# Patient Record
Sex: Female | Born: 1995 | Race: Asian | Hispanic: No | Marital: Single | Smoking: Never smoker
Health system: Southern US, Community
[De-identification: ages and names within clinical notes are randomized; demographics above are authoritative.]

## PROBLEM LIST (undated history)

## (undated) DIAGNOSIS — Z789 Other specified health status: Secondary | ICD-10-CM

## (undated) DIAGNOSIS — F32A Depression, unspecified: Secondary | ICD-10-CM

## (undated) HISTORY — PX: NO PAST SURGERIES: SHX2092

---
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
## 2017-08-13 ENCOUNTER — Encounter (HOSPITAL_BASED_OUTPATIENT_CLINIC_OR_DEPARTMENT_OTHER): Payer: Self-pay | Admitting: *Deleted

## 2017-08-13 ENCOUNTER — Emergency Department (HOSPITAL_BASED_OUTPATIENT_CLINIC_OR_DEPARTMENT_OTHER)
Admission: EM | Admit: 2017-08-13 | Discharge: 2017-08-13 | Disposition: A | Discharge status: Home / Self Care | Payer: Self-pay | Attending: Emergency Medicine | Admitting: Emergency Medicine

## 2017-08-13 ENCOUNTER — Other Ambulatory Visit: Payer: Self-pay

## 2017-08-13 DIAGNOSIS — Y999 Unspecified external cause status: Secondary | ICD-10-CM | POA: Insufficient documentation

## 2017-08-13 DIAGNOSIS — Y9389 Activity, other specified: Secondary | ICD-10-CM | POA: Insufficient documentation

## 2017-08-13 DIAGNOSIS — Y92410 Unspecified street and highway as the place of occurrence of the external cause: Secondary | ICD-10-CM | POA: Insufficient documentation

## 2017-08-13 DIAGNOSIS — T148XXA Other injury of unspecified body region, initial encounter: Secondary | ICD-10-CM | POA: Insufficient documentation

## 2017-08-13 MED ORDER — METHOCARBAMOL 500 MG PO TABS: 500.0000 mg | ORAL_TABLET | Freq: Two times a day (BID) | ORAL | 0 refills | Status: AC
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
## 2017-08-13 NOTE — Discharge Instructions (Signed)
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
## 2018-01-19 DIAGNOSIS — Z3200 Encounter for pregnancy test, result unknown: Secondary | ICD-10-CM | POA: Diagnosis not present

## 2018-01-30 DIAGNOSIS — Z3490 Encounter for supervision of normal pregnancy, unspecified, unspecified trimester: Secondary | ICD-10-CM | POA: Insufficient documentation

## 2018-01-31 ENCOUNTER — Ambulatory Visit (INDEPENDENT_AMBULATORY_CARE_PROVIDER_SITE_OTHER): Payer: Medicaid Other | Admitting: Certified Nurse Midwife

## 2018-01-31 ENCOUNTER — Other Ambulatory Visit (HOSPITAL_COMMUNITY)
Admission: RE | Admit: 2018-01-31 | Discharge: 2018-01-31 | Disposition: A | Discharge status: Home / Self Care | Payer: Medicaid Other | Source: Ambulatory Visit | Attending: Certified Nurse Midwife | Admitting: Certified Nurse Midwife

## 2018-01-31 ENCOUNTER — Encounter: Payer: Self-pay | Admitting: Certified Nurse Midwife

## 2018-01-31 VITALS — BP 106/66 | HR 91 | Wt 109.4 lb

## 2018-01-31 DIAGNOSIS — Z3401 Encounter for supervision of normal first pregnancy, first trimester: Secondary | ICD-10-CM | POA: Diagnosis not present

## 2018-01-31 DIAGNOSIS — Z3491 Encounter for supervision of normal pregnancy, unspecified, first trimester: Secondary | ICD-10-CM | POA: Diagnosis not present

## 2018-01-31 DIAGNOSIS — Z3A10 10 weeks gestation of pregnancy: Secondary | ICD-10-CM | POA: Diagnosis not present

## 2018-01-31 DIAGNOSIS — Z3490 Encounter for supervision of normal pregnancy, unspecified, unspecified trimester: Secondary | ICD-10-CM

## 2018-01-31 DIAGNOSIS — Z36 Encounter for antenatal screening for chromosomal anomalies: Secondary | ICD-10-CM | POA: Diagnosis not present

## 2018-01-31 NOTE — Patient Instructions (Signed)
First Trimester of Pregnancy The first trimester of pregnancy is from week 1 until the end of week 13 (months 1 through 3). During this time, your baby will begin to develop inside you. At 6-8 weeks, the eyes and face are formed, and the heartbeat can be seen on ultrasound. At the end of 12 weeks, all the baby's organs are formed. Prenatal care is all the medical care you receive before the birth of your baby. Make sure you get good prenatal care and follow all of your doctor's instructions. Follow these instructions at home: Medicines  Take over-the-counter and prescription medicines only as told by your doctor. Some medicines are safe and some medicines are not safe during pregnancy.  Take a prenatal vitamin that contains at least 600 micrograms (mcg) of folic acid.  If you have trouble pooping (constipation), take medicine that will make your stool soft (stool softener) if your doctor approves. Eating and drinking  Eat regular, healthy meals.  Your doctor will tell you the amount of weight gain that is right for you.  Avoid raw meat and uncooked cheese.  If you feel sick to your stomach (nauseous) or throw up (vomit): ? Eat 4 or 5 small meals a day instead of 3 large meals. ? Try eating a few soda crackers. ? Drink liquids between meals instead of during meals.  To prevent constipation: ? Eat foods that are high in fiber, like fresh fruits and vegetables, whole grains, and beans. ? Drink enough fluids to keep your pee (urine) clear or pale yellow. Activity  Exercise only as told by your doctor. Stop exercising if you have cramps or pain in your lower belly (abdomen) or low back.  Do not exercise if it is too hot, too humid, or if you are in a place of great height (high altitude).  Try to avoid standing for long periods of time. Move your legs often if you must stand in one place for a long time.  Avoid heavy lifting.  Wear low-heeled shoes. Sit and stand up straight.  You  can have sex unless your doctor tells you not to. Relieving pain and discomfort  Wear a good support bra if your breasts are sore.  Take warm water baths (sitz baths) to soothe pain or discomfort caused by hemorrhoids. Use hemorrhoid cream if your doctor says it is okay.  Rest with your legs raised if you have leg cramps or low back pain.  If you have puffy, bulging veins (varicose veins) in your legs: ? Wear support hose or compression stockings as told by your doctor. ? Raise (elevate) your feet for 15 minutes, 3-4 times a day. ? Limit salt in your food. Prenatal care  Schedule your prenatal visits by the twelfth week of pregnancy.  Write down your questions. Take them to your prenatal visits.  Keep all your prenatal visits as told by your doctor. This is important. Safety  Wear your seat belt at all times when driving.  Make a list of emergency phone numbers. The list should include numbers for family, friends, the hospital, and police and fire departments. General instructions  Ask your doctor for a referral to a local prenatal class. Begin classes no later than at the start of month 6 of your pregnancy.  Ask for help if you need counseling or if you need help with nutrition. Your doctor can give you advice or tell you where to go for help.  Do not use hot tubs, steam rooms, or   saunas.  Do not douche or use tampons or scented sanitary pads.  Do not cross your legs for long periods of time.  Avoid all herbs and alcohol. Avoid drugs that are not approved by your doctor.  Do not use any tobacco products, including cigarettes, chewing tobacco, and electronic cigarettes. If you need help quitting, ask your doctor. You may get counseling or other support to help you quit.  Avoid cat litter boxes and soil used by cats. These carry germs that can cause birth defects in the baby and can cause a loss of your baby (miscarriage) or stillbirth.  Visit your dentist. At home, brush  your teeth with a soft toothbrush. Be gentle when you floss. Contact a doctor if:  You are dizzy.  You have mild cramps or pressure in your lower belly.  You have a nagging pain in your belly area.  You continue to feel sick to your stomach, you throw up, or you have watery poop (diarrhea).  You have a bad smelling fluid coming from your vagina.  You have pain when you pee (urinate).  You have increased puffiness (swelling) in your face, hands, legs, or ankles. Get help right away if:  You have a fever.  You are leaking fluid from your vagina.  You have spotting or bleeding from your vagina.  You have very bad belly cramping or pain.  You gain or lose weight rapidly.  You throw up blood. It may look like coffee grounds.  You are around people who have German measles, fifth disease, or chickenpox.  You have a very bad headache.  You have shortness of breath.  You have any kind of trauma, such as from a fall or a car accident. Summary  The first trimester of pregnancy is from week 1 until the end of week 13 (months 1 through 3).  To take care of yourself and your unborn baby, you will need to eat healthy meals, take medicines only if your doctor tells you to do so, and do activities that are safe for you and your baby.  Keep all follow-up visits as told by your doctor. This is important as your doctor will have to ensure that your baby is healthy and growing well. This information is not intended to replace advice given to you by your health care provider. Make sure you discuss any questions you have with your health care provider. Document Released: 10/05/2007 Document Revised: 04/26/2016 Document Reviewed: 04/26/2016 Elsevier Interactive Patient Education  2017 Elsevier Inc.  

## 2018-01-31 NOTE — Progress Notes (Signed)
Patient is in the office for initial ob visit. FOB is involved and living together.

## 2018-01-31 NOTE — Progress Notes (Signed)
Subjective:   Cynthia Arnold is a 22 y.o. G1P0 at [redacted]w[redacted]d by LMP being seen today for her first obstetrical visit.  Her obstetrical history is significant for nothing. Patient does intend to breast feed. Pregnancy history fully reviewed.  Patient reports no complaints.  HISTORY: OB History  Gravida Para Term Preterm AB Living  1 0 0 0 0 0  SAB TAB Ectopic Multiple Live Births  0 0 0 0 0    # Outcome Date GA Lbr Len/2nd Weight Sex Delivery Anes PTL Lv  1 Current            She has never had Pap smear   History reviewed. No pertinent past medical history. History reviewed. No pertinent surgical history. History reviewed. No pertinent family history. Social History   Tobacco Use  . Smoking status: Never Smoker  . Smokeless tobacco: Never Used  Substance Use Topics  . Alcohol use: Yes  . Drug use: Not Currently   No Known Allergies Current Outpatient Medications on File Prior to Visit  Medication Sig Dispense Refill  . Prenatal Vit-Fe Fumarate-FA (PRENATAL MULTIVITAMIN) TABS tablet Take 1 tablet by mouth daily at 12 noon.     No current facility-administered medications on file prior to visit.     Review of Systems Pertinent items noted in HPI and remainder of comprehensive ROS otherwise negative.  Exam   Vitals:   01/31/18 1349  BP: 106/66  Pulse: 91  Weight: 109 lb 6.4 oz (49.6 kg)   Fetal Heart Rate (bpm): 167  Uterus:   retroverted, above suprapubic   Pelvic Exam: Perineum: no hemorrhoids, normal perineum   Vulva: normal external genitalia, no lesions   Vagina:  normal mucosa, normal discharge   Cervix: no lesions and normal, pap smear done. Anterior cervix   Adnexa: normal adnexa and no mass, fullness, tenderness   Bony Pelvis: average  System: General: well-developed, well-nourished female in no acute distress   Breast:  normal appearance, no masses or tenderness   Skin: normal coloration and turgor, no rashes   Neurologic: oriented, normal, negative,  normal mood   Extremities: normal strength, tone, and muscle mass, ROM of all joints is normal   HEENT PERRLA, extraocular movement intact and sclera clear   Mouth/Teeth mucous membranes moist, pharynx normal without lesions and dental hygiene good   Neck supple and no masses   Cardiovascular: regular rate and rhythm   Respiratory:  no respiratory distress, normal breath sounds   Abdomen: soft, non-tender; bowel sounds normal; no masses,  no organomegaly     Assessment:   Pregnancy: G1P0 Patient Active Problem List   Diagnosis Date Noted  . Supervision of normal pregnancy 01/30/2018     Plan:  1. Encounter for supervision of normal first pregnancy in first trimester - Patient doing well no complaints  - Enroll Patient in Babyscripts - Culture, OB Urine - Obstetric Panel, Including HIV - Genetic Screening - Hemoglobinopathy evaluation - Cystic Fibrosis Mutation 97 - Cytology - PAP   Initial labs drawn. Continue prenatal vitamins. Genetic Screening discussed, NIPS: ordered. Ultrasound discussed; fetal anatomic survey: requested. Problem list reviewed and updated. The nature of [facility] with multiple MDs and other Advanced Practice Providers was explained to patient; also emphasized that residents, students are part of our team. Routine obstetric precautions reviewed. Return in about 4 weeks (around 02/28/2018) for ROB.    Sharyon Cable, CNM [facility] [employer]

## 2018-02-01 LAB — CYTOLOGY - PAP
Bacterial vaginitis: NEGATIVE
Candida vaginitis: NEGATIVE
Chlamydia: NEGATIVE
Diagnosis: NEGATIVE
Neisseria Gonorrhea: NEGATIVE
Trichomonas: NEGATIVE

## 2018-02-03 LAB — HEMOGLOBINOPATHY EVALUATION
HGB C: 0 %
HGB S: 0 %
HGB VARIANT: 0 %
Hemoglobin A2 Quantitation: 2.3 % (ref 1.8–3.2)
Hemoglobin F Quantitation: 0 % (ref 0.0–2.0)
Hgb A: 97.7 % (ref 96.4–98.8)

## 2018-02-03 LAB — OBSTETRIC PANEL, INCLUDING HIV
Antibody Screen: NEGATIVE
Basophils Absolute: 0.1 10*3/uL (ref 0.0–0.2)
Basos: 1 %
EOS (ABSOLUTE): 0.1 10*3/uL (ref 0.0–0.4)
Eos: 1 %
HIV Screen 4th Generation wRfx: NONREACTIVE
Hematocrit: 34.7 % (ref 34.0–46.6)
Hemoglobin: 11.5 g/dL (ref 11.1–15.9)
Hepatitis B Surface Ag: NEGATIVE
Immature Grans (Abs): 0 10*3/uL (ref 0.0–0.1)
Immature Granulocytes: 1 %
Lymphocytes Absolute: 1.3 10*3/uL (ref 0.7–3.1)
Lymphs: 16 %
MCH: 30.7 pg (ref 26.6–33.0)
MCHC: 33.1 g/dL (ref 31.5–35.7)
MCV: 93 fL (ref 79–97)
Monocytes Absolute: 0.6 10*3/uL (ref 0.1–0.9)
Monocytes: 8 %
Neutrophils Absolute: 6.1 10*3/uL (ref 1.4–7.0)
Neutrophils: 73 %
Platelets: 298 10*3/uL (ref 150–450)
RBC: 3.74 x10E6/uL — ABNORMAL LOW (ref 3.77–5.28)
RDW: 11.5 % — ABNORMAL LOW (ref 12.3–15.4)
RPR Ser Ql: NONREACTIVE
Rh Factor: POSITIVE
Rubella Antibodies, IGG: 2.87 index (ref >0.99)
WBC: 8.2 10*3/uL (ref 3.4–10.8)

## 2018-02-03 LAB — URINE CULTURE, OB REFLEX: Organism ID, Bacteria: NO GROWTH

## 2018-02-03 LAB — CULTURE, OB URINE

## 2018-02-06 ENCOUNTER — Encounter: Payer: Self-pay | Admitting: Certified Nurse Midwife

## 2018-02-08 LAB — CYSTIC FIBROSIS MUTATION 97: Interpretation: NOT DETECTED

## 2018-02-13 ENCOUNTER — Encounter: Payer: Self-pay | Admitting: *Deleted

## 2018-02-28 ENCOUNTER — Encounter: Payer: Self-pay | Admitting: Certified Nurse Midwife

## 2018-02-28 ENCOUNTER — Ambulatory Visit (INDEPENDENT_AMBULATORY_CARE_PROVIDER_SITE_OTHER): Payer: Medicaid Other | Admitting: Certified Nurse Midwife

## 2018-02-28 DIAGNOSIS — Z3490 Encounter for supervision of normal pregnancy, unspecified, unspecified trimester: Secondary | ICD-10-CM

## 2018-02-28 NOTE — Patient Instructions (Signed)
Second Trimester of Pregnancy The second trimester is from week 13 through week 28, month 4 through 6. This is often the time in pregnancy that you feel your best. Often times, morning sickness has lessened or quit. You may have more energy, and you may get hungry more often. Your unborn baby (fetus) is growing rapidly. At the end of the sixth month, he or she is about 9 inches long and weighs about 1 pounds. You will likely feel the baby move (quickening) between 18 and 20 weeks of pregnancy. Follow these instructions at home:  Avoid all smoking, herbs, and alcohol. Avoid drugs not approved by your doctor.  Do not use any tobacco products, including cigarettes, chewing tobacco, and electronic cigarettes. If you need help quitting, ask your doctor. You may get counseling or other support to help you quit.  Only take medicine as told by your doctor. Some medicines are safe and some are not during pregnancy.  Exercise only as told by your doctor. Stop exercising if you start having cramps.  Eat regular, healthy meals.  Wear a good support bra if your breasts are tender.  Do not use hot tubs, steam rooms, or saunas.  Wear your seat belt when driving.  Avoid raw meat, uncooked cheese, and liter boxes and soil used by cats.  Take your prenatal vitamins.  Take 1500-2000 milligrams of calcium daily starting at the 20th week of pregnancy until you deliver your baby.  Try taking medicine that helps you poop (stool softener) as needed, and if your doctor approves. Eat more fiber by eating fresh fruit, vegetables, and whole grains. Drink enough fluids to keep your pee (urine) clear or pale yellow.  Take warm water baths (sitz baths) to soothe pain or discomfort caused by hemorrhoids. Use hemorrhoid cream if your doctor approves.  If you have puffy, bulging veins (varicose veins), wear support hose. Raise (elevate) your feet for 15 minutes, 3-4 times a day. Limit salt in your diet.  Avoid heavy  lifting, wear low heals, and sit up straight.  Rest with your legs raised if you have leg cramps or low back pain.  Visit your dentist if you have not gone during your pregnancy. Use a soft toothbrush to brush your teeth. Be gentle when you floss.  You can have sex (intercourse) unless your doctor tells you not to.  Go to your doctor visits. Get help if:  You feel dizzy.  You have mild cramps or pressure in your lower belly (abdomen).  You have a nagging pain in your belly area.  You continue to feel sick to your stomach (nauseous), throw up (vomit), or have watery poop (diarrhea).  You have bad smelling fluid coming from your vagina.  You have pain with peeing (urination). Get help right away if:  You have a fever.  You are leaking fluid from your vagina.  You have spotting or bleeding from your vagina.  You have severe belly cramping or pain.  You lose or gain weight rapidly.  You have trouble catching your breath and have chest pain.  You notice sudden or extreme puffiness (swelling) of your face, hands, ankles, feet, or legs.  You have not felt the baby move in over an hour.  You have severe headaches that do not go away with medicine.  You have vision changes. This information is not intended to replace advice given to you by your health care provider. Make sure you discuss any questions you have with your health care   provider. Document Released: 07/13/2009 Document Revised: 09/24/2015 Document Reviewed: 06/19/2012 Elsevier Interactive Patient Education  2017 Elsevier Inc.  

## 2018-02-28 NOTE — Progress Notes (Signed)
   PRENATAL VISIT NOTE  Subjective:  Cynthia Arnold is a 22 y.o. G1P0 at [redacted]w[redacted]d being seen today for ongoing prenatal care.  She is currently monitored for the following issues for this low-risk pregnancy and has Supervision of normal pregnancy on their problem list.  Patient reports no complaints.  Contractions: Not present. Vag. Bleeding: None.  Movement: Absent. Denies leaking of fluid.   The following portions of the patient's history were reviewed and updated as appropriate: allergies, current medications, past family history, past medical history, past social history, past surgical history and problem list. Problem list updated.  Objective:   Vitals:   02/28/18 1022  BP: 105/66  Pulse: 88  Weight: 112 lb (50.8 kg)    Fetal Status: Fetal Heart Rate (bpm): 148   Movement: Absent     General:  Alert, oriented and cooperative. Patient is in no acute distress.  Skin: Skin is warm and dry. No rash noted.   Cardiovascular: Normal heart rate noted  Respiratory: Normal respiratory effort, no problems with respiration noted  Abdomen: Soft, gravid, appropriate for gestational age.  Pain/Pressure: Present     Pelvic: Cervical exam deferred        Extremities: Normal range of motion.  Edema: None  Mental Status: Normal mood and affect. Normal behavior. Normal judgment and thought content.   Assessment and Plan:  Pregnancy: G1P0 at [redacted]w[redacted]d  1. Encounter for supervision of normal pregnancy, antepartum, unspecified gravidity - Patient doing well, no complaints  - Anticipatory guidance on upcoming appointments  - Anatomy US ordered - Results of NOB labs reviewed with patient  - [place] MFM OB COMP + 14 WK; Future  Preterm labor symptoms and general obstetric precautions including but not limited to vaginal bleeding, contractions, leaking of fluid and fetal movement were reviewed in detail with the patient. Please refer to After Visit Summary for other counseling recommendations.  Return in about  4 weeks (around 03/28/2018) for ROB.  Future Appointments  Date Time Provider Department Center  03/28/2018  9:15 AM Hermina Staggers, MD [facility] None  04/03/2018  8:45 AM [facility] [place] 2 [facility]    Sharyon Cable, CNM

## 2018-02-28 NOTE — Progress Notes (Signed)
Pt has no concerns today 

## 2018-03-26 ENCOUNTER — Encounter (HOSPITAL_COMMUNITY): Payer: Self-pay

## 2018-03-28 ENCOUNTER — Ambulatory Visit (INDEPENDENT_AMBULATORY_CARE_PROVIDER_SITE_OTHER): Payer: Medicaid Other | Admitting: Obstetrics and Gynecology

## 2018-03-28 ENCOUNTER — Encounter: Payer: Self-pay | Admitting: Obstetrics and Gynecology

## 2018-03-28 VITALS — BP 96/64 | HR 88 | Wt 116.0 lb

## 2018-03-28 DIAGNOSIS — Z3A18 18 weeks gestation of pregnancy: Secondary | ICD-10-CM

## 2018-03-28 DIAGNOSIS — Z3402 Encounter for supervision of normal first pregnancy, second trimester: Secondary | ICD-10-CM

## 2018-03-28 DIAGNOSIS — Z23 Encounter for immunization: Secondary | ICD-10-CM | POA: Diagnosis not present

## 2018-03-28 NOTE — Progress Notes (Signed)
ROB w/ no concerns today.  

## 2018-03-28 NOTE — Patient Instructions (Signed)

## 2018-03-28 NOTE — Addendum Note (Signed)
Addended by: Marya LandryFOSTER, SUZANNE D on: 03/28/2018 10:24 AM   Modules accepted: Orders

## 2018-03-28 NOTE — Progress Notes (Signed)
Subjective:  Cynthia Arnold is a 22 y.o. G1P0 at 269w5d being seen today for ongoing prenatal care.  She is currently monitored for the following issues for this low-risk pregnancy and has Supervision of normal pregnancy on their problem list.  Patient reports no complaints.  Contractions: Not present. Vag. Bleeding: None.  Movement: Absent. Denies leaking of fluid.   The following portions of the patient's history were reviewed and updated as appropriate: allergies, current medications, past family history, past medical history, past social history, past surgical history and problem list. Problem list updated.  Objective:   Vitals:   03/28/18 0938  BP: 96/64  Pulse: 88  Weight: 116 lb (52.6 kg)    Fetal Status: Fetal Heart Rate (bpm): 146   Movement: Absent     General:  Alert, oriented and cooperative. Patient is in no acute distress.  Skin: Skin is warm and dry. No rash noted.   Cardiovascular: Normal heart rate noted  Respiratory: Normal respiratory effort, no problems with respiration noted  Abdomen: Soft, gravid, appropriate for gestational age. Pain/Pressure: Absent     Pelvic:  Cervical exam deferred        Extremities: Normal range of motion.  Edema: None  Mental Status: Normal mood and affect. Normal behavior. Normal judgment and thought content.   Urinalysis:      Assessment and Plan:  Pregnancy: G1P0 at 649w5d  1. Encounter for supervision of normal first pregnancy in second trimester Flu vaccine today Declines AFP Anatomy scan next week  Preterm labor symptoms and general obstetric precautions including but not limited to vaginal bleeding, contractions, leaking of fluid and fetal movement were reviewed in detail with the patient. Please refer to After Visit Summary for other counseling recommendations.  Return in about 4 weeks (around 04/25/2018) for OB visit.   Hermina StaggersErvin,  L, MD

## 2018-04-03 ENCOUNTER — Ambulatory Visit (HOSPITAL_COMMUNITY)
Admission: RE | Admit: 2018-04-03 | Discharge: 2018-04-03 | Disposition: A | Discharge status: Home / Self Care | Payer: Medicaid Other | Source: Ambulatory Visit | Attending: Certified Nurse Midwife | Admitting: Certified Nurse Midwife

## 2018-04-03 DIAGNOSIS — Z363 Encounter for antenatal screening for malformations: Secondary | ICD-10-CM | POA: Insufficient documentation

## 2018-04-03 DIAGNOSIS — Z3490 Encounter for supervision of normal pregnancy, unspecified, unspecified trimester: Secondary | ICD-10-CM

## 2018-04-03 DIAGNOSIS — Z3A19 19 weeks gestation of pregnancy: Secondary | ICD-10-CM | POA: Diagnosis not present

## 2018-04-23 ENCOUNTER — Encounter: Payer: Self-pay | Admitting: Obstetrics and Gynecology

## 2018-04-23 ENCOUNTER — Ambulatory Visit (INDEPENDENT_AMBULATORY_CARE_PROVIDER_SITE_OTHER): Payer: Medicaid Other | Admitting: Obstetrics and Gynecology

## 2018-04-23 VITALS — BP 99/61 | HR 87 | Wt 122.1 lb

## 2018-04-23 DIAGNOSIS — Z3402 Encounter for supervision of normal first pregnancy, second trimester: Secondary | ICD-10-CM

## 2018-04-23 NOTE — Progress Notes (Signed)
Pt is here for ROB. 2972w3d.

## 2018-04-23 NOTE — Patient Instructions (Signed)

## 2018-04-23 NOTE — Progress Notes (Signed)
Subjective:  Cynthia Arnold is a 22 y.o. G1P0 at 7838w3d being seen today for ongoing prenatal care.  She is currently monitored for the following issues for this low-risk pregnancy and has Supervision of normal pregnancy on their problem list.  Patient reports no complaints.  Contractions: Not present. Vag. Bleeding: None.  Movement: Present. Denies leaking of fluid.   The following portions of the patient's history were reviewed and updated as appropriate: allergies, current medications, past family history, past medical history, past social history, past surgical history and problem list. Problem list updated.  Objective:   Vitals:   04/23/18 1020  BP: 99/61  Pulse: 87  Weight: 122 lb 1.6 oz (55.4 kg)    Fetal Status: Fetal Heart Rate (bpm): 148   Movement: Present     General:  Alert, oriented and cooperative. Patient is in no acute distress.  Skin: Skin is warm and dry. No rash noted.   Cardiovascular: Normal heart rate noted  Respiratory: Normal respiratory effort, no problems with respiration noted  Abdomen: Soft, gravid, appropriate for gestational age. Pain/Pressure: Absent     Pelvic:  Cervical exam deferred        Extremities: Normal range of motion.  Edema: None  Mental Status: Normal mood and affect. Normal behavior. Normal judgment and thought content.   Urinalysis:      Assessment and Plan:  Pregnancy: G1P0 at 2338w3d  1. Encounter for supervision of normal first pregnancy in second trimester Stable  Preterm labor symptoms and general obstetric precautions including but not limited to vaginal bleeding, contractions, leaking of fluid and fetal movement were reviewed in detail with the patient. Please refer to After Visit Summary for other counseling recommendations.  Return in about 4 weeks (around 05/21/2018) for OB visit.   Hermina StaggersErvin, Charisma Charlot L, MD

## 2018-05-02 NOTE — L&D Delivery Note (Signed)
OB/[facility]N Faculty [facility] Delivery Note  Cynthia Arnold is a 23 y.o. G1P1001 s/p SVD at [redacted]w[redacted]d. She was admitted for post-dates induction of labor.   ROM: 7h 89m with meconium-stained fluid GBS Status: negative Maximum Maternal Temperature: Temp (48hrs), Avg:98.9 F (37.2 C), Min:98.3 F (36.8 C), Max:99.5 F (37.5 C)  Labor Progress: . Admitted and induction started with FB and cytotec . Transitioned to pitocin . Epidural placement . AROM light meconium fluid . Progressed to complete, labored down for about an hour because not feeling pressure  Delivery Date/Time: 09/01/18 at 0822 Delivery: Called to room and patient was complete and pushing. Head delivered ROA. No nuchal cord present. Shoulder and body delivered in usual fashion. Infant with spontaneous cry, placed on mother's abdomen, dried and stimulated. Cord clamped x 2 after 1-minute delay, and cut by father of baby. Cord blood drawn. Placenta delivered spontaneously with gentle cord traction. Fundus firm with massage and Pitocin. Labia, perineum, vagina, and cervix inspected inspected with 1st degree perineal laceration extending into labia and vaginal sidewall (stellate) as well as periurethral laceration.   Placenta: spontaneous, intact, 3-vessel cord (to be discarded) Complications: PPH - mostly related to persistently bleeding stellate laceration, excellent tone and given IM methergine x 1 Lacerations: 1st degree perineal extending into left labial and left vaginal side wall just distal to hymenal ring - appreciate Dr. Deretha Emory assistance with repair - use of 3-0 Vicryl, 3-0 Vicryl Rapide and 4-0 Monocryl - repaired in usual fashion with locked running suture as well as several figure of eight sutures - called to room about 2 hours after delivery because of concern for perineal hematoma but on exam no area of fluctuance and more consistent with swelling related to repair EBL: 1032cc per triton Analgesia: epidural   Postpartum  Planning [x]  message to sent to schedule follow-up  [x]  vaccines UTD  Infant: Vigorous female  APGARs 8, 9  3019g  Meyer Arora S. Earlene Plater, DO OB/GYN Fellow, Faculty Practice

## 2018-05-21 ENCOUNTER — Encounter: Payer: Self-pay | Admitting: Obstetrics and Gynecology

## 2018-05-21 ENCOUNTER — Ambulatory Visit (INDEPENDENT_AMBULATORY_CARE_PROVIDER_SITE_OTHER): Payer: Medicaid Other | Admitting: Obstetrics and Gynecology

## 2018-05-21 DIAGNOSIS — Z3402 Encounter for supervision of normal first pregnancy, second trimester: Secondary | ICD-10-CM

## 2018-05-21 NOTE — Progress Notes (Signed)
   PRENATAL VISIT NOTE  Subjective:  Cynthia Arnold is a 23 y.o. G1P0 at [redacted]w[redacted]d being seen today for ongoing prenatal care.  She is currently monitored for the following issues for this low-risk pregnancy and has Supervision of normal pregnancy on their problem list.  Patient reports no complaints.  Contractions: Not present. Vag. Bleeding: None.  Movement: Present. Denies leaking of fluid.   The following portions of the patient's history were reviewed and updated as appropriate: allergies, current medications, past family history, past medical history, past social history, past surgical history and problem list. Problem list updated.  Objective:   Vitals:   05/21/18 0937  BP: 110/72  Pulse: 92  Weight: 124 lb 9.6 oz (56.5 kg)    Fetal Status: Fetal Heart Rate (bpm): 140 Fundal Height: 26 cm Movement: Present     General:  Alert, oriented and cooperative. Patient is in no acute distress.  Skin: Skin is warm and dry. No rash noted.   Cardiovascular: Normal heart rate noted  Respiratory: Normal respiratory effort, no problems with respiration noted  Abdomen: Soft, gravid, appropriate for gestational age.  Pain/Pressure: Absent     Pelvic: Cervical exam deferred        Extremities: Normal range of motion.  Edema: None  Mental Status: Normal mood and affect. Normal behavior. Normal judgment and thought content.   Assessment and Plan:  Pregnancy: G1P0 at [redacted]w[redacted]d  1. Encounter for supervision of normal first pregnancy in second trimester Patient is doing well without complaints Third trimester labs next visit  Preterm labor symptoms and general obstetric precautions including but not limited to vaginal bleeding, contractions, leaking of fluid and fetal movement were reviewed in detail with the patient. Please refer to After Visit Summary for other counseling recommendations.  Return in about 2 weeks (around 06/04/2018) for ROB, 2 hr glucola next visit.  No future appointments.  Catalina Antigua, MD

## 2018-05-21 NOTE — Progress Notes (Signed)
Patient reports fetal movement, denies pain. 

## 2018-06-04 ENCOUNTER — Encounter: Payer: Self-pay | Admitting: Obstetrics and Gynecology

## 2018-06-04 ENCOUNTER — Other Ambulatory Visit: Payer: Medicaid Other

## 2018-06-04 ENCOUNTER — Ambulatory Visit (INDEPENDENT_AMBULATORY_CARE_PROVIDER_SITE_OTHER): Payer: Medicaid Other | Admitting: Obstetrics and Gynecology

## 2018-06-04 VITALS — BP 101/65 | HR 99 | Wt 126.8 lb

## 2018-06-04 DIAGNOSIS — Z3402 Encounter for supervision of normal first pregnancy, second trimester: Secondary | ICD-10-CM

## 2018-06-04 DIAGNOSIS — Z3403 Encounter for supervision of normal first pregnancy, third trimester: Secondary | ICD-10-CM

## 2018-06-04 NOTE — Progress Notes (Signed)
   PRENATAL VISIT NOTE  Subjective:  Cynthia Arnold is a 23 y.o. G1P0 at [redacted]w[redacted]d being seen today for ongoing prenatal care.  She is currently monitored for the following issues for this low-risk pregnancy and has Supervision of normal pregnancy on their problem list.  Patient reports no complaints.  Contractions: Not present. Vag. Bleeding: None.  Movement: Present. Denies leaking of fluid.   The following portions of the patient's history were reviewed and updated as appropriate: allergies, current medications, past family history, past medical history, past social history, past surgical history and problem list. Problem list updated.  Objective:   Vitals:   06/04/18 0937  BP: 101/65  Pulse: 99  Weight: 126 lb 12.8 oz (57.5 kg)    Fetal Status: Fetal Heart Rate (bpm): 140 Fundal Height: 27 cm Movement: Present     General:  Alert, oriented and cooperative. Patient is in no acute distress.  Skin: Skin is warm and dry. No rash noted.   Cardiovascular: Normal heart rate noted  Respiratory: Normal respiratory effort, no problems with respiration noted  Abdomen: Soft, gravid, appropriate for gestational age.  Pain/Pressure: Absent     Pelvic: Cervical exam deferred        Extremities: Normal range of motion.  Edema: None  Mental Status: Normal mood and affect. Normal behavior. Normal judgment and thought content.   Assessment and Plan:  Pregnancy: G1P0 at [redacted]w[redacted]d  1. Encounter for supervision of normal first pregnancy in second trimester Patient is doing well without complaints Third trimester labs today Patient plans to use condoms  Patient is still researching pediatrician  Preterm labor symptoms and general obstetric precautions including but not limited to vaginal bleeding, contractions, leaking of fluid and fetal movement were reviewed in detail with the patient. Please refer to After Visit Summary for other counseling recommendations.  Return in about 2 weeks (around 06/18/2018)  for ROB.  Future Appointments  Date Time Provider Department Center  06/04/2018 11:15 AM Neiko Trivedi, Gigi Gin, MD [facility] None    Catalina Antigua, MD \

## 2018-06-04 NOTE — Progress Notes (Signed)
Patient reports fetal movement, denies pain. 

## 2018-06-05 LAB — CBC
HEMATOCRIT: 34.4 % (ref 34.0–46.6)
HEMOGLOBIN: 11.6 g/dL (ref 11.1–15.9)
MCH: 32.5 pg (ref 26.6–33.0)
MCHC: 33.7 g/dL (ref 31.5–35.7)
MCV: 96 fL (ref 79–97)
Platelets: 266 10*3/uL (ref 150–450)
RBC: 3.57 x10E6/uL — ABNORMAL LOW (ref 3.77–5.28)
RDW: 11.3 % — ABNORMAL LOW (ref 11.7–15.4)
WBC: 12.9 10*3/uL — ABNORMAL HIGH (ref 3.4–10.8)

## 2018-06-05 LAB — GLUCOSE TOLERANCE, 2 HOURS W/ 1HR
GLUCOSE, 2 HOUR: 97 mg/dL (ref 65–152)
Glucose, 1 hour: 97 mg/dL (ref 65–179)
Glucose, Fasting: 77 mg/dL (ref 65–91)

## 2018-06-05 LAB — HIV ANTIBODY (ROUTINE TESTING W REFLEX): HIV Screen 4th Generation wRfx: NONREACTIVE

## 2018-06-05 LAB — RPR: RPR: NONREACTIVE

## 2018-06-18 ENCOUNTER — Ambulatory Visit (INDEPENDENT_AMBULATORY_CARE_PROVIDER_SITE_OTHER): Payer: Medicaid Other | Admitting: Obstetrics and Gynecology

## 2018-06-18 ENCOUNTER — Encounter: Payer: Self-pay | Admitting: Obstetrics and Gynecology

## 2018-06-18 VITALS — BP 115/68 | HR 88 | Wt 128.0 lb

## 2018-06-18 DIAGNOSIS — Z3402 Encounter for supervision of normal first pregnancy, second trimester: Secondary | ICD-10-CM

## 2018-06-18 DIAGNOSIS — Z3403 Encounter for supervision of normal first pregnancy, third trimester: Secondary | ICD-10-CM

## 2018-06-18 DIAGNOSIS — Z3A30 30 weeks gestation of pregnancy: Secondary | ICD-10-CM

## 2018-06-18 NOTE — Progress Notes (Signed)
   PRENATAL VISIT NOTE  Subjective:  Cynthia Arnold is a 23 y.o. G1P0 at [redacted]w[redacted]d being seen today for ongoing prenatal care.  She is currently monitored for the following issues for this low-risk pregnancy and has Supervision of normal pregnancy on their problem list.  Patient reports no complaints.  Contractions: Not present. Vag. Bleeding: None.  Movement: Present. Denies leaking of fluid.   The following portions of the patient's history were reviewed and updated as appropriate: allergies, current medications, past family history, past medical history, past social history, past surgical history and problem list. Problem list updated.  Objective:   Vitals:   06/18/18 1100  BP: 115/68  Pulse: 88  Weight: 128 lb (58.1 kg)    Fetal Status: Fetal Heart Rate (bpm): 146 Fundal Height: 30 cm Movement: Present     General:  Alert, oriented and cooperative. Patient is in no acute distress.  Skin: Skin is warm and dry. No rash noted.   Cardiovascular: Normal heart rate noted  Respiratory: Normal respiratory effort, no problems with respiration noted  Abdomen: Soft, gravid, appropriate for gestational age.  Pain/Pressure: Absent     Pelvic: Cervical exam deferred        Extremities: Normal range of motion.  Edema: None  Mental Status: Normal mood and affect. Normal behavior. Normal judgment and thought content.   Assessment and Plan:  Pregnancy: G1P0 at [redacted]w[redacted]d  1. Encounter for supervision of normal first pregnancy in second trimester Patient is doing well without complaints Normal 2 hour glucola reviewed  Preterm labor symptoms and general obstetric precautions including but not limited to vaginal bleeding, contractions, leaking of fluid and fetal movement were reviewed in detail with the patient. Please refer to After Visit Summary for other counseling recommendations.  Return in about 2 weeks (around 07/02/2018) for ROB.  Future Appointments  Date Time Provider Department Center    07/02/2018 10:30 AM Leftwich-Kirby, Wilmer Floor, CNM [facility] None    Catalina Antigua, MD

## 2018-07-02 ENCOUNTER — Ambulatory Visit (INDEPENDENT_AMBULATORY_CARE_PROVIDER_SITE_OTHER): Payer: Medicaid Other | Admitting: Advanced Practice Midwife

## 2018-07-02 VITALS — BP 107/67 | HR 105 | Wt 133.0 lb

## 2018-07-02 DIAGNOSIS — Z3402 Encounter for supervision of normal first pregnancy, second trimester: Secondary | ICD-10-CM

## 2018-07-02 DIAGNOSIS — Z3A32 32 weeks gestation of pregnancy: Secondary | ICD-10-CM

## 2018-07-02 DIAGNOSIS — Z3403 Encounter for supervision of normal first pregnancy, third trimester: Secondary | ICD-10-CM

## 2018-07-02 NOTE — Progress Notes (Signed)
   PRENATAL VISIT NOTE  Subjective:  Cynthia Arnold is a 23 y.o. G1P0 at [redacted]w[redacted]d being seen today for ongoing prenatal care.  She is currently monitored for the following issues for this low-risk pregnancy and has Supervision of normal pregnancy on their problem list.  Patient reports no complaints.  Contractions: Not present. Vag. Bleeding: None.  Movement: Present. Denies leaking of fluid.   The following portions of the patient's history were reviewed and updated as appropriate: allergies, current medications, past family history, past medical history, past social history, past surgical history and problem list. Problem list updated.  Objective:   Vitals:   07/02/18 1033  BP: 107/67  Pulse: (!) 105  Weight: 60.3 kg    Fetal Status: Fetal Heart Rate (bpm): 130   Movement: Present     General:  Alert, oriented and cooperative. Patient is in no acute distress.  Skin: Skin is warm and dry. No rash noted.   Cardiovascular: Normal heart rate noted  Respiratory: Normal respiratory effort, no problems with respiration noted  Abdomen: Soft, gravid, appropriate for gestational age.  Pain/Pressure: Absent     Pelvic: Cervical exam deferred        Extremities: Normal range of motion.  Edema: Trace  Mental Status: Normal mood and affect. Normal behavior. Normal judgment and thought content.   Assessment and Plan:  Pregnancy: G1P0 at [redacted]w[redacted]d  1. Encounter for supervision of normal first pregnancy in second trimester --Anticipatory guidance about next visits/weeks of pregnancy given.   Preterm labor symptoms and general obstetric precautions including but not limited to vaginal bleeding, contractions, leaking of fluid and fetal movement were reviewed in detail with the patient. Please refer to After Visit Summary for other counseling recommendations.  No follow-ups on file.  No future appointments.  Sharen Counter, CNM

## 2018-07-02 NOTE — Patient Instructions (Signed)
Third Trimester of Pregnancy The third trimester is from week 28 through week 40 (months 7 through 9). The third trimester is a time when the unborn baby (fetus) is growing rapidly. At the end of the ninth month, the fetus is about 20 inches in length and weighs 6-10 pounds. Body changes during your third trimester Your body will continue to go through many changes during pregnancy. The changes vary from woman to woman. During the third trimester:  Your weight will continue to increase. You can expect to gain 25-35 pounds (11-16 kg) by the end of the pregnancy.  You may begin to get stretch marks on your hips, abdomen, and breasts.  You may urinate more often because the fetus is moving lower into your pelvis and pressing on your bladder.  You may develop or continue to have heartburn. This is caused by increased hormones that slow down muscles in the digestive tract.  You may develop or continue to have constipation because increased hormones slow digestion and cause the muscles that push waste through your intestines to relax.  You may develop hemorrhoids. These are swollen veins (varicose veins) in the rectum that can itch or be painful.  You may develop swollen, bulging veins (varicose veins) in your legs.  You may have increased body aches in the pelvis, back, or thighs. This is due to weight gain and increased hormones that are relaxing your joints.  You may have changes in your hair. These can include thickening of your hair, rapid growth, and changes in texture. Some women also have hair loss during or after pregnancy, or hair that feels dry or thin. Your hair will most likely return to normal after your baby is born.  Your breasts will continue to grow and they will continue to become tender. A yellow fluid (colostrum) may leak from your breasts. This is the first milk you are producing for your baby.  Your belly button may stick out.  You may notice more swelling in your hands,  face, or ankles.  You may have increased tingling or numbness in your hands, arms, and legs. The skin on your belly may also feel numb.  You may feel short of breath because of your expanding uterus.  You may have more problems sleeping. This can be caused by the size of your belly, increased need to urinate, and an increase in your body's metabolism.  You may notice the fetus "dropping," or moving lower in your abdomen (lightening).  You may have increased vaginal discharge.  You may notice your joints feel loose and you may have pain around your pelvic bone. What to expect at prenatal visits You will have prenatal exams every 2 weeks until week 36. Then you will have weekly prenatal exams. During a routine prenatal visit:  You will be weighed to make sure you and the baby are growing normally.  Your blood pressure will be taken.  Your abdomen will be measured to track your baby's growth.  The fetal heartbeat will be listened to.  Any test results from the previous visit will be discussed.  You may have a cervical check near your due date to see if your cervix has softened or thinned (effaced).  You will be tested for Group B streptococcus. This happens between 35 and 37 weeks. Your health care provider may ask you:  What your birth plan is.  How you are feeling.  If you are feeling the baby move.  If you have had any abnormal   symptoms, such as leaking fluid, bleeding, severe headaches, or abdominal cramping.  If you are using any tobacco products, including cigarettes, chewing tobacco, and electronic cigarettes.  If you have any questions. Other tests or screenings that may be performed during your third trimester include:  Blood tests that check for low iron levels (anemia).  Fetal testing to check the health, activity level, and growth of the fetus. Testing is done if you have certain medical conditions or if there are problems during the pregnancy.  Nonstress test  (NST). This test checks the health of your baby to make sure there are no signs of problems, such as the baby not getting enough oxygen. During this test, a belt is placed around your belly. The baby is made to move, and its heart rate is monitored during movement. What is false labor? False labor is a condition in which you feel small, irregular tightenings of the muscles in the womb (contractions) that usually go away with rest, changing position, or drinking water. These are called Braxton Hicks contractions. Contractions may last for hours, days, or even weeks before true labor sets in. If contractions come at regular intervals, become more frequent, increase in intensity, or become painful, you should see your health care provider. What are the signs of labor?  Abdominal cramps.  Regular contractions that start at 10 minutes apart and become stronger and more frequent with time.  Contractions that start on the top of the uterus and spread down to the lower abdomen and back.  Increased pelvic pressure and dull back pain.  A watery or bloody mucus discharge that comes from the vagina.  Leaking of amniotic fluid. This is also known as your "water breaking." It could be a slow trickle or a gush. Let your health care provider know if it has a color or strange odor. If you have any of these signs, call your health care provider right away, even if it is before your due date. Follow these instructions at home: Medicines  Follow your health care provider's instructions regarding medicine use. Specific medicines may be either safe or unsafe to take during pregnancy.  Take a prenatal vitamin that contains at least 600 micrograms (mcg) of folic acid.  If you develop constipation, try taking a stool softener if your health care provider approves. Eating and drinking   Eat a balanced diet that includes fresh fruits and vegetables, whole grains, good sources of protein such as meat, eggs, or tofu,  and low-fat dairy. Your health care provider will help you determine the amount of weight gain that is right for you.  Avoid raw meat and uncooked cheese. These carry germs that can cause birth defects in the baby.  If you have low calcium intake from food, talk to your health care provider about whether you should take a daily calcium supplement.  Eat four or five small meals rather than three large meals a day.  Limit foods that are high in fat and processed sugars, such as fried and sweet foods.  To prevent constipation: ? Drink enough fluid to keep your urine clear or pale yellow. ? Eat foods that are high in fiber, such as fresh fruits and vegetables, whole grains, and beans. Activity  Exercise only as directed by your health care provider. Most women can continue their usual exercise routine during pregnancy. Try to exercise for 30 minutes at least 5 days a week. Stop exercising if you experience uterine contractions.  Avoid heavy lifting.  Do   not exercise in extreme heat or humidity, or at high altitudes.  Wear low-heel, comfortable shoes.  Practice good posture.  You may continue to have sex unless your health care provider tells you otherwise. Relieving pain and discomfort  Take frequent breaks and rest with your legs elevated if you have leg cramps or low back pain.  Take warm sitz baths to soothe any pain or discomfort caused by hemorrhoids. Use hemorrhoid cream if your health care provider approves.  Wear a good support bra to prevent discomfort from breast tenderness.  If you develop varicose veins: ? Wear support pantyhose or compression stockings as told by your healthcare provider. ? Elevate your feet for 15 minutes, 3-4 times a day. Prenatal care  Write down your questions. Take them to your prenatal visits.  Keep all your prenatal visits as told by your health care provider. This is important. Safety  Wear your seat belt at all times when driving.  Make  a list of emergency phone numbers, including numbers for family, friends, the hospital, and police and fire departments. General instructions  Avoid cat litter boxes and soil used by cats. These carry germs that can cause birth defects in the baby. If you have a cat, ask someone to clean the litter box for you.  Do not travel far distances unless it is absolutely necessary and only with the approval of your health care provider.  Do not use hot tubs, steam rooms, or saunas.  Do not drink alcohol.  Do not use any products that contain nicotine or tobacco, such as cigarettes and e-cigarettes. If you need help quitting, ask your health care provider.  Do not use any medicinal herbs or unprescribed drugs. These chemicals affect the formation and growth of the baby.  Do not douche or use tampons or scented sanitary pads.  Do not cross your legs for long periods of time.  To prepare for the arrival of your baby: ? Take prenatal classes to understand, practice, and ask questions about labor and delivery. ? Make a trial run to the hospital. ? Visit the hospital and tour the maternity area. ? Arrange for maternity or paternity leave through employers. ? Arrange for family and friends to take care of pets while you are in the hospital. ? Purchase a rear-facing car seat and make sure you know how to install it in your car. ? Pack your hospital bag. ? Prepare the baby's nursery. Make sure to remove all pillows and stuffed animals from the baby's crib to prevent suffocation.  Visit your dentist if you have not gone during your pregnancy. Use a soft toothbrush to brush your teeth and be gentle when you floss. Contact a health care provider if:  You are unsure if you are in labor or if your water has broken.  You become dizzy.  You have mild pelvic cramps, pelvic pressure, or nagging pain in your abdominal area.  You have lower back pain.  You have persistent nausea, vomiting, or  diarrhea.  You have an unusual or bad smelling vaginal discharge.  You have pain when you urinate. Get help right away if:  Your water breaks before 37 weeks.  You have regular contractions less than 5 minutes apart before 37 weeks.  You have a fever.  You are leaking fluid from your vagina.  You have spotting or bleeding from your vagina.  You have severe abdominal pain or cramping.  You have rapid weight loss or weight gain.  You have   shortness of breath with chest pain.  You notice sudden or extreme swelling of your face, hands, ankles, feet, or legs.  Your baby makes fewer than 10 movements in 2 hours.  You have severe headaches that do not go away when you take medicine.  You have vision changes. Summary  The third trimester is from week 28 through week 40, months 7 through 9. The third trimester is a time when the unborn baby (fetus) is growing rapidly.  During the third trimester, your discomfort may increase as you and your baby continue to gain weight. You may have abdominal, leg, and back pain, sleeping problems, and an increased need to urinate.  During the third trimester your breasts will keep growing and they will continue to become tender. A yellow fluid (colostrum) may leak from your breasts. This is the first milk you are producing for your baby.  False labor is a condition in which you feel small, irregular tightenings of the muscles in the womb (contractions) that eventually go away. These are called Braxton Hicks contractions. Contractions may last for hours, days, or even weeks before true labor sets in.  Signs of labor can include: abdominal cramps; regular contractions that start at 10 minutes apart and become stronger and more frequent with time; watery or bloody mucus discharge that comes from the vagina; increased pelvic pressure and dull back pain; and leaking of amniotic fluid. This information is not intended to replace advice given to you by your  health care provider. Make sure you discuss any questions you have with your health care provider. Document Released: 04/12/2001 Document Revised: 05/24/2016 Document Reviewed: 05/24/2016 Elsevier Interactive Patient Education  2019 [employer]  

## 2018-07-16 ENCOUNTER — Ambulatory Visit (INDEPENDENT_AMBULATORY_CARE_PROVIDER_SITE_OTHER): Payer: Medicaid Other | Admitting: Advanced Practice Midwife

## 2018-07-16 ENCOUNTER — Other Ambulatory Visit: Payer: Self-pay

## 2018-07-16 VITALS — BP 111/72 | HR 81 | Wt 136.0 lb

## 2018-07-16 DIAGNOSIS — Z3493 Encounter for supervision of normal pregnancy, unspecified, third trimester: Secondary | ICD-10-CM

## 2018-07-16 DIAGNOSIS — Z3490 Encounter for supervision of normal pregnancy, unspecified, unspecified trimester: Secondary | ICD-10-CM

## 2018-07-16 DIAGNOSIS — Z3A34 34 weeks gestation of pregnancy: Secondary | ICD-10-CM

## 2018-07-16 NOTE — Progress Notes (Addendum)
   PRENATAL VISIT NOTE  Subjective:  Cynthia Arnold is a 23 y.o. G1P0 at [redacted]w[redacted]d being seen today for ongoing prenatal care.  She is currently monitored for the following issues for this low-risk pregnancy and has Supervision of normal pregnancy on their problem list.  Patient reports no complaints.  Contractions: Not present. Vag. Bleeding: None.  Movement: Present. Denies leaking of fluid.   The following portions of the patient's history were reviewed and updated as appropriate: allergies, current medications, past family history, past medical history, past social history, past surgical history and problem list.   Objective:   Vitals:   07/16/18 0834  BP: 111/72  Pulse: 81  Weight: 61.7 kg    Fetal Status: Fetal Heart Rate (bpm): 135 Fundal Height: 34 cm Movement: Present     General:  Alert, oriented and cooperative. Patient is in no acute distress.  Skin: Skin is warm and dry. No rash noted.   Cardiovascular: Normal heart rate noted  Respiratory: Normal respiratory effort, no problems with respiration noted  Abdomen: Soft, gravid, appropriate for gestational age.  Pain/Pressure: Absent     Pelvic: Cervical exam deferred        Extremities: Normal range of motion.     Mental Status: Normal mood and affect. Normal behavior. Normal judgment and thought content.   Assessment and Plan:  Pregnancy: G1P0 at [redacted]w[redacted]d 1. Encounter for supervision of normal pregnancy, antepartum, unspecified gravidity --Anticipatory guidance about next visits/weeks of pregnancy given. --Questions answered about providers, who is on call, etc for [facility] practices.  Preterm labor symptoms and general obstetric precautions including but not limited to vaginal bleeding, contractions, leaking of fluid and fetal movement were reviewed in detail with the patient. Please refer to After Visit Summary for other counseling recommendations.   Return in about 2 weeks (around 07/30/2018).  Future Appointments  Date Time  Provider Department Center  07/31/2018  1:00 PM Leftwich-Kirby, Wilmer Floor, CNM [facility] None    Sharen Counter, CNM

## 2018-07-16 NOTE — Patient Instructions (Signed)
Third Trimester of Pregnancy The third trimester is from week 28 through week 40 (months 7 through 9). The third trimester is a time when the unborn baby (fetus) is growing rapidly. At the end of the ninth month, the fetus is about 20 inches in length and weighs 6-10 pounds. Body changes during your third trimester Your body will continue to go through many changes during pregnancy. The changes vary from woman to woman. During the third trimester:  Your weight will continue to increase. You can expect to gain 25-35 pounds (11-16 kg) by the end of the pregnancy.  You may begin to get stretch marks on your hips, abdomen, and breasts.  You may urinate more often because the fetus is moving lower into your pelvis and pressing on your bladder.  You may develop or continue to have heartburn. This is caused by increased hormones that slow down muscles in the digestive tract.  You may develop or continue to have constipation because increased hormones slow digestion and cause the muscles that push waste through your intestines to relax.  You may develop hemorrhoids. These are swollen veins (varicose veins) in the rectum that can itch or be painful.  You may develop swollen, bulging veins (varicose veins) in your legs.  You may have increased body aches in the pelvis, back, or thighs. This is due to weight gain and increased hormones that are relaxing your joints.  You may have changes in your hair. These can include thickening of your hair, rapid growth, and changes in texture. Some women also have hair loss during or after pregnancy, or hair that feels dry or thin. Your hair will most likely return to normal after your baby is born.  Your breasts will continue to grow and they will continue to become tender. A yellow fluid (colostrum) may leak from your breasts. This is the first milk you are producing for your baby.  Your belly button may stick out.  You may notice more swelling in your hands,  face, or ankles.  You may have increased tingling or numbness in your hands, arms, and legs. The skin on your belly may also feel numb.  You may feel short of breath because of your expanding uterus.  You may have more problems sleeping. This can be caused by the size of your belly, increased need to urinate, and an increase in your body's metabolism.  You may notice the fetus "dropping," or moving lower in your abdomen (lightening).  You may have increased vaginal discharge.  You may notice your joints feel loose and you may have pain around your pelvic bone. What to expect at prenatal visits You will have prenatal exams every 2 weeks until week 36. Then you will have weekly prenatal exams. During a routine prenatal visit:  You will be weighed to make sure you and the baby are growing normally.  Your blood pressure will be taken.  Your abdomen will be measured to track your baby's growth.  The fetal heartbeat will be listened to.  Any test results from the previous visit will be discussed.  You may have a cervical check near your due date to see if your cervix has softened or thinned (effaced).  You will be tested for Group B streptococcus. This happens between 35 and 37 weeks. Your health care provider may ask you:  What your birth plan is.  How you are feeling.  If you are feeling the baby move.  If you have had any abnormal   symptoms, such as leaking fluid, bleeding, severe headaches, or abdominal cramping.  If you are using any tobacco products, including cigarettes, chewing tobacco, and electronic cigarettes.  If you have any questions. Other tests or screenings that may be performed during your third trimester include:  Blood tests that check for low iron levels (anemia).  Fetal testing to check the health, activity level, and growth of the fetus. Testing is done if you have certain medical conditions or if there are problems during the pregnancy.  Nonstress test  (NST). This test checks the health of your baby to make sure there are no signs of problems, such as the baby not getting enough oxygen. During this test, a belt is placed around your belly. The baby is made to move, and its heart rate is monitored during movement. What is false labor? False labor is a condition in which you feel small, irregular tightenings of the muscles in the womb (contractions) that usually go away with rest, changing position, or drinking water. These are called Braxton Hicks contractions. Contractions may last for hours, days, or even weeks before true labor sets in. If contractions come at regular intervals, become more frequent, increase in intensity, or become painful, you should see your health care provider. What are the signs of labor?  Abdominal cramps.  Regular contractions that start at 10 minutes apart and become stronger and more frequent with time.  Contractions that start on the top of the uterus and spread down to the lower abdomen and back.  Increased pelvic pressure and dull back pain.  A watery or bloody mucus discharge that comes from the vagina.  Leaking of amniotic fluid. This is also known as your "water breaking." It could be a slow trickle or a gush. Let your health care provider know if it has a color or strange odor. If you have any of these signs, call your health care provider right away, even if it is before your due date. Follow these instructions at home: Medicines  Follow your health care provider's instructions regarding medicine use. Specific medicines may be either safe or unsafe to take during pregnancy.  Take a prenatal vitamin that contains at least 600 micrograms (mcg) of folic acid.  If you develop constipation, try taking a stool softener if your health care provider approves. Eating and drinking   Eat a balanced diet that includes fresh fruits and vegetables, whole grains, good sources of protein such as meat, eggs, or tofu,  and low-fat dairy. Your health care provider will help you determine the amount of weight gain that is right for you.  Avoid raw meat and uncooked cheese. These carry germs that can cause birth defects in the baby.  If you have low calcium intake from food, talk to your health care provider about whether you should take a daily calcium supplement.  Eat four or five small meals rather than three large meals a day.  Limit foods that are high in fat and processed sugars, such as fried and sweet foods.  To prevent constipation: ? Drink enough fluid to keep your urine clear or pale yellow. ? Eat foods that are high in fiber, such as fresh fruits and vegetables, whole grains, and beans. Activity  Exercise only as directed by your health care provider. Most women can continue their usual exercise routine during pregnancy. Try to exercise for 30 minutes at least 5 days a week. Stop exercising if you experience uterine contractions.  Avoid heavy lifting.  Do   not exercise in extreme heat or humidity, or at high altitudes.  Wear low-heel, comfortable shoes.  Practice good posture.  You may continue to have sex unless your health care provider tells you otherwise. Relieving pain and discomfort  Take frequent breaks and rest with your legs elevated if you have leg cramps or low back pain.  Take warm sitz baths to soothe any pain or discomfort caused by hemorrhoids. Use hemorrhoid cream if your health care provider approves.  Wear a good support bra to prevent discomfort from breast tenderness.  If you develop varicose veins: ? Wear support pantyhose or compression stockings as told by your healthcare provider. ? Elevate your feet for 15 minutes, 3-4 times a day. Prenatal care  Write down your questions. Take them to your prenatal visits.  Keep all your prenatal visits as told by your health care provider. This is important. Safety  Wear your seat belt at all times when driving.  Make  a list of emergency phone numbers, including numbers for family, friends, the hospital, and police and fire departments. General instructions  Avoid cat litter boxes and soil used by cats. These carry germs that can cause birth defects in the baby. If you have a cat, ask someone to clean the litter box for you.  Do not travel far distances unless it is absolutely necessary and only with the approval of your health care provider.  Do not use hot tubs, steam rooms, or saunas.  Do not drink alcohol.  Do not use any products that contain nicotine or tobacco, such as cigarettes and e-cigarettes. If you need help quitting, ask your health care provider.  Do not use any medicinal herbs or unprescribed drugs. These chemicals affect the formation and growth of the baby.  Do not douche or use tampons or scented sanitary pads.  Do not cross your legs for long periods of time.  To prepare for the arrival of your baby: ? Take prenatal classes to understand, practice, and ask questions about labor and delivery. ? Make a trial run to the hospital. ? Visit the hospital and tour the maternity area. ? Arrange for maternity or paternity leave through employers. ? Arrange for family and friends to take care of pets while you are in the hospital. ? Purchase a rear-facing car seat and make sure you know how to install it in your car. ? Pack your hospital bag. ? Prepare the baby's nursery. Make sure to remove all pillows and stuffed animals from the baby's crib to prevent suffocation.  Visit your dentist if you have not gone during your pregnancy. Use a soft toothbrush to brush your teeth and be gentle when you floss. Contact a health care provider if:  You are unsure if you are in labor or if your water has broken.  You become dizzy.  You have mild pelvic cramps, pelvic pressure, or nagging pain in your abdominal area.  You have lower back pain.  You have persistent nausea, vomiting, or  diarrhea.  You have an unusual or bad smelling vaginal discharge.  You have pain when you urinate. Get help right away if:  Your water breaks before 37 weeks.  You have regular contractions less than 5 minutes apart before 37 weeks.  You have a fever.  You are leaking fluid from your vagina.  You have spotting or bleeding from your vagina.  You have severe abdominal pain or cramping.  You have rapid weight loss or weight gain.  You have   shortness of breath with chest pain.  You notice sudden or extreme swelling of your face, hands, ankles, feet, or legs.  Your baby makes fewer than 10 movements in 2 hours.  You have severe headaches that do not go away when you take medicine.  You have vision changes. Summary  The third trimester is from week 28 through week 40, months 7 through 9. The third trimester is a time when the unborn baby (fetus) is growing rapidly.  During the third trimester, your discomfort may increase as you and your baby continue to gain weight. You may have abdominal, leg, and back pain, sleeping problems, and an increased need to urinate.  During the third trimester your breasts will keep growing and they will continue to become tender. A yellow fluid (colostrum) may leak from your breasts. This is the first milk you are producing for your baby.  False labor is a condition in which you feel small, irregular tightenings of the muscles in the womb (contractions) that eventually go away. These are called Braxton Hicks contractions. Contractions may last for hours, days, or even weeks before true labor sets in.  Signs of labor can include: abdominal cramps; regular contractions that start at 10 minutes apart and become stronger and more frequent with time; watery or bloody mucus discharge that comes from the vagina; increased pelvic pressure and dull back pain; and leaking of amniotic fluid. This information is not intended to replace advice given to you by your  health care provider. Make sure you discuss any questions you have with your health care provider. Document Released: 04/12/2001 Document Revised: 05/24/2016 Document Reviewed: 05/24/2016 Elsevier Interactive Patient Education  2019 [employer]  

## 2018-07-31 ENCOUNTER — Other Ambulatory Visit: Payer: Self-pay

## 2018-07-31 ENCOUNTER — Ambulatory Visit (INDEPENDENT_AMBULATORY_CARE_PROVIDER_SITE_OTHER): Payer: Medicaid Other | Admitting: Advanced Practice Midwife

## 2018-07-31 ENCOUNTER — Other Ambulatory Visit (HOSPITAL_COMMUNITY)
Admission: RE | Admit: 2018-07-31 | Discharge: 2018-07-31 | Disposition: A | Discharge status: Home / Self Care | Payer: Medicaid Other | Source: Ambulatory Visit | Attending: Advanced Practice Midwife | Admitting: Advanced Practice Midwife

## 2018-07-31 VITALS — BP 108/72 | HR 95 | Temp 99.1°F | Wt 139.9 lb

## 2018-07-31 DIAGNOSIS — Z3A36 36 weeks gestation of pregnancy: Secondary | ICD-10-CM

## 2018-07-31 DIAGNOSIS — Z3403 Encounter for supervision of normal first pregnancy, third trimester: Secondary | ICD-10-CM | POA: Insufficient documentation

## 2018-07-31 DIAGNOSIS — O26843 Uterine size-date discrepancy, third trimester: Secondary | ICD-10-CM

## 2018-07-31 NOTE — Progress Notes (Signed)
   PRENATAL VISIT NOTE  Subjective:  Cynthia Arnold is a 23 y.o. G1P0 at [redacted]w[redacted]d being seen today for ongoing prenatal care.  She is currently monitored for the following issues for this low-risk pregnancy and has Supervision of normal pregnancy on their problem list.  Patient reports no complaints.  Contractions: Not present. Vag. Bleeding: None.  Movement: Present. Denies leaking of fluid.   The following portions of the patient's history were reviewed and updated as appropriate: allergies, current medications, past family history, past medical history, past social history, past surgical history and problem list.   Objective:   Vitals:   07/31/18 1313  BP: 108/72  Pulse: 95  Temp: 99.1 F (37.3 C)  Weight: 63.5 kg    Fetal Status: Fetal Heart Rate (bpm): 140 Fundal Height: 34 cm Movement: Present  Presentation: Vertex  General:  Alert, oriented and cooperative. Patient is in no acute distress.  Skin: Skin is warm and dry. No rash noted.   Cardiovascular: Normal heart rate noted  Respiratory: Normal respiratory effort, no problems with respiration noted  Abdomen: Soft, gravid, appropriate for gestational age.  Pain/Pressure: Absent     Pelvic: Cervical exam performed Dilation: Closed Effacement (%): 50 Station: Ballotable  Extremities: Normal range of motion.  Edema: Trace  Mental Status: Normal mood and affect. Normal behavior. Normal judgment and thought content.   Assessment and Plan:  Pregnancy: G1P0 at [redacted]w[redacted]d 1. Encounter for supervision of normal first pregnancy in third trimester --Anticipatory guidance about next visits/weeks of pregnancy given. --Reviewed safety, visitor policy, reassurance about COVID-19 for pregnancy at this time. Discussed possible changes to visits, including televisits, that may occur due to COVID-19.  The office remains open if pt needs to be seen and MAU is open 24 hours/day for OB emergencies.   - Cervicovaginal ancillary only( [place]) - Strep  Gp B NAA  2. Uterine size date discrepancy pregnancy, third trimester --Pt is constitutionally small, size always slightly behind dates but today is 34 cm at [redacted]w[redacted]d. Will evaluate with outpatient [place] at [facility]. - [place] [facility] OB FOLLOW UP; Future  Term labor symptoms and general obstetric precautions including but not limited to vaginal bleeding, contractions, leaking of fluid and fetal movement were reviewed in detail with the patient. Please refer to After Visit Summary for other counseling recommendations.   Return in about 1 week (around 08/07/2018).  No future appointments.  Sharen Counter, CNM

## 2018-07-31 NOTE — Progress Notes (Signed)
Pt here for routine OB. Pt has no complaints.

## 2018-07-31 NOTE — Patient Instructions (Signed)
Labor Precautions Reasons to come to MAU at [facility]:  1.  Contractions are  5 minutes apart or less, each last 1 minute, these have been going on for 1-2 hours, and you cannot walk or talk during them 2.  You have a large gush of fluid, or a trickle of fluid that will not stop and you have to wear a pad 3.  You have bleeding that is bright red, heavier than spotting--like menstrual bleeding (spotting can be normal in early labor or after a check of your cervix) 4.  You do not feel the baby moving like he/she normally does  

## 2018-08-01 LAB — CERVICOVAGINAL ANCILLARY ONLY
CHLAMYDIA, DNA PROBE: NEGATIVE
Neisseria Gonorrhea: NEGATIVE

## 2018-08-02 LAB — STREP GP B NAA: STREP GROUP B AG: NEGATIVE

## 2018-08-08 ENCOUNTER — Other Ambulatory Visit: Payer: Self-pay

## 2018-08-08 ENCOUNTER — Ambulatory Visit (INDEPENDENT_AMBULATORY_CARE_PROVIDER_SITE_OTHER): Payer: Medicaid Other | Admitting: Obstetrics

## 2018-08-08 ENCOUNTER — Encounter: Payer: Self-pay | Admitting: Obstetrics

## 2018-08-08 DIAGNOSIS — Z3403 Encounter for supervision of normal first pregnancy, third trimester: Secondary | ICD-10-CM

## 2018-08-08 DIAGNOSIS — Z3402 Encounter for supervision of normal first pregnancy, second trimester: Secondary | ICD-10-CM

## 2018-08-08 DIAGNOSIS — Z3A37 37 weeks gestation of pregnancy: Secondary | ICD-10-CM

## 2018-08-08 NOTE — Progress Notes (Signed)
   TELEHEALTH VIRTUAL OBSTETRICS VISIT ENCOUNTER NOTE  I connected with Cynthia Arnold on 08/08/18 at  4:00 PM EDT by telephone at home and verified that I am speaking with the correct person using two identifiers.   I discussed the limitations, risks, security and privacy concerns of performing an evaluation and management service by telephone and the availability of in person appointments. I also discussed with the patient that there may be a patient responsible charge related to this service. The patient expressed understanding and agreed to proceed.  Subjective:  Cynthia Arnold is a 23 y.o. G1P0 at [redacted]w[redacted]d being followed for ongoing prenatal care.  She is currently monitored for the following issues for this low-risk pregnancy and has Supervision of normal pregnancy on their problem list.  Patient reports backache. Reports fetal movement. Denies any contractions, bleeding or leaking of fluid.   The following portions of the patient's history were reviewed and updated as appropriate: allergies, current medications, past family history, past medical history, past social history, past surgical history and problem list.   Objective:   General:  Alert, oriented and cooperative.   Mental Status: Normal mood and affect perceived. Normal judgment and thought content.  Rest of physical exam deferred due to type of encounter  Assessment and Plan:  Pregnancy: G1P0 at [redacted]w[redacted]d 1. Encounter for supervision of normal first pregnancy in second trimester   Term labor symptoms and general obstetric precautions including but not limited to vaginal bleeding, contractions, leaking of fluid and fetal movement were reviewed in detail with the patient.  I discussed the assessment and treatment plan with the patient. The patient was provided an opportunity to ask questions and all were answered. The patient agreed with the plan and demonstrated an understanding of the instructions. The patient was advised to call back or  seek an in-person office evaluation/go to MAU at [facility] for any urgent or concerning symptoms. Please refer to After Visit Summary for other counseling recommendations.   I provided 9 minutes of non-face-to-face time during this encounter.  Return in about 1 week (around 08/15/2018) for Televisit  .  Future Appointments  Date Time Provider Department Center  08/14/2018  1:30 PM [facility] [place] 5 [facility]    Coral Ceo, MD Center for [facility], [facility]

## 2018-08-13 ENCOUNTER — Ambulatory Visit: Payer: Medicaid Other | Admitting: Advanced Practice Midwife

## 2018-08-13 ENCOUNTER — Telehealth: Payer: Self-pay | Admitting: Advanced Practice Midwife

## 2018-08-13 DIAGNOSIS — Z3402 Encounter for supervision of normal first pregnancy, second trimester: Secondary | ICD-10-CM

## 2018-08-13 NOTE — Telephone Encounter (Signed)
Pt presented to [facility] today for her scheduled prenatal visit but the office was closed due to power outage.  I called pt today at 4:20 pm and again at 4:30 pm in attempt to do a telehealth visit.  Since she was not available, I left a message for pt to return call to the office and reschedule her prenatal appointment.

## 2018-08-14 ENCOUNTER — Other Ambulatory Visit: Payer: Self-pay

## 2018-08-14 ENCOUNTER — Ambulatory Visit (HOSPITAL_COMMUNITY)
Admission: RE | Admit: 2018-08-14 | Discharge: 2018-08-14 | Disposition: A | Discharge status: Home / Self Care | Payer: Medicaid Other | Source: Ambulatory Visit | Attending: Obstetrics and Gynecology | Admitting: Obstetrics and Gynecology

## 2018-08-14 DIAGNOSIS — O26843 Uterine size-date discrepancy, third trimester: Secondary | ICD-10-CM | POA: Diagnosis not present

## 2018-08-14 DIAGNOSIS — Z362 Encounter for other antenatal screening follow-up: Secondary | ICD-10-CM | POA: Diagnosis not present

## 2018-08-14 DIAGNOSIS — Z3A38 38 weeks gestation of pregnancy: Secondary | ICD-10-CM

## 2018-08-20 ENCOUNTER — Other Ambulatory Visit: Payer: Self-pay

## 2018-08-20 ENCOUNTER — Ambulatory Visit (INDEPENDENT_AMBULATORY_CARE_PROVIDER_SITE_OTHER): Payer: Medicaid Other | Admitting: Advanced Practice Midwife

## 2018-08-20 VITALS — BP 110/73 | HR 85

## 2018-08-20 DIAGNOSIS — Z3A39 39 weeks gestation of pregnancy: Secondary | ICD-10-CM

## 2018-08-20 DIAGNOSIS — Z3402 Encounter for supervision of normal first pregnancy, second trimester: Secondary | ICD-10-CM

## 2018-08-20 NOTE — Progress Notes (Signed)
Pt is on the phone for televisit. [redacted]w[redacted]d. Pt has Babyscripts BP cuff at home and was able to check BP while on the phone.

## 2018-08-20 NOTE — Progress Notes (Signed)
   TELEHEALTH VIRTUAL OBSTETRICS VISIT ENCOUNTER NOTE  I connected with Cynthia Arnold on 08/20/18 at  1:35 PM EDT by telephone at home and verified that I am speaking with the correct person using two identifiers.   I discussed the limitations, risks, security and privacy concerns of performing an evaluation and management service by telephone and the availability of in person appointments. I also discussed with the patient that there may be a patient responsible charge related to this service. The patient expressed understanding and agreed to proceed.  Subjective:  Cynthia Arnold is a 23 y.o. G1P0 at [redacted]w[redacted]d being followed for ongoing prenatal care.  She is currently monitored for the following issues for this low-risk pregnancy and has Supervision of normal pregnancy on their problem list.  Patient reports occasional contractions. Reports fetal movement. Denies any contractions, bleeding or leaking of fluid.   The following portions of the patient's history were reviewed and updated as appropriate: allergies, current medications, past family history, past medical history, past social history, past surgical history and problem list.   Objective:   General:  Alert, oriented and cooperative.   Mental Status: Normal mood and affect perceived. Normal judgment and thought content.  Rest of physical exam deferred due to type of encounter  Assessment and Plan:  Pregnancy: G1P0 at [redacted]w[redacted]d 1. Encounter for supervision of normal first pregnancy in second trimester --Anticipatory guidance about next visits/weeks of pregnancy given. --Reviewed safety, visitor policy, reassurance about COVID-19 for pregnancy at this time. Discussed possible changes to visits, including televisits, that may occur due to COVID-19.  The office remains open if pt needs to be seen and MAU is open 24 hours/day for OB emergencies.   --Pt has Babyscripts cuff but BP do not transfer to Epic.   BP today 110/78.    --Next visit in  person for post 40 week NST, will schedule IOl at that visit if needed.  Reviewed signs of labor/reasons to come to MAU.  Term labor symptoms and general obstetric precautions including but not limited to vaginal bleeding, contractions, leaking of fluid and fetal movement were reviewed in detail with the patient.  I discussed the assessment and treatment plan with the patient. The patient was provided an opportunity to ask questions and all were answered. The patient agreed with the plan and demonstrated an understanding of the instructions. The patient was advised to call back or seek an in-person office evaluation/go to MAU at [facility] for any urgent or concerning symptoms. Please refer to After Visit Summary for other counseling recommendations.   I provided 10 minutes of non-face-to-face time during this encounter.  No follow-ups on file.  Future Appointments  Date Time Provider Department Center  08/20/2018  1:35 PM Leftwich-Kirby, Wilmer Floor, CNM [facility] None    Sharen Counter, CNM Center for [employer], [facility]

## 2018-08-27 ENCOUNTER — Other Ambulatory Visit: Payer: Self-pay | Admitting: Family Medicine

## 2018-08-27 ENCOUNTER — Ambulatory Visit (INDEPENDENT_AMBULATORY_CARE_PROVIDER_SITE_OTHER): Payer: Medicaid Other | Admitting: Advanced Practice Midwife

## 2018-08-27 ENCOUNTER — Telehealth (HOSPITAL_COMMUNITY): Payer: Self-pay | Admitting: *Deleted

## 2018-08-27 ENCOUNTER — Other Ambulatory Visit: Payer: Self-pay

## 2018-08-27 DIAGNOSIS — Z3403 Encounter for supervision of normal first pregnancy, third trimester: Secondary | ICD-10-CM

## 2018-08-27 DIAGNOSIS — Z3402 Encounter for supervision of normal first pregnancy, second trimester: Secondary | ICD-10-CM

## 2018-08-27 DIAGNOSIS — Z3A40 40 weeks gestation of pregnancy: Secondary | ICD-10-CM

## 2018-08-27 NOTE — Progress Notes (Signed)
Patient reports fetal movement with pressure. 

## 2018-08-27 NOTE — Telephone Encounter (Signed)
Preadmission screen  

## 2018-08-27 NOTE — Patient Instructions (Signed)
Labor Precautions Reasons to come to MAU at [facility]:  1.  Contractions are  5 minutes apart or less, each last 1 minute, these have been going on for 1-2 hours, and you cannot walk or talk during them 2.  You have a large gush of fluid, or a trickle of fluid that will not stop and you have to wear a pad 3.  You have bleeding that is bright red, heavier than spotting--like menstrual bleeding (spotting can be normal in early labor or after a check of your cervix) 4.  You do not feel the baby moving like he/she normally does  

## 2018-08-27 NOTE — Progress Notes (Signed)
   PRENATAL VISIT NOTE  Subjective:  Cynthia Arnold is a 23 y.o. G1P0 at [redacted]w[redacted]d being seen today for ongoing prenatal care.  She is currently monitored for the following issues for this low-risk pregnancy and has Supervision of normal pregnancy on their problem list.  Patient reports no complaints.  Contractions: Irritability. Vag. Bleeding: None.  Movement: Present. Denies leaking of fluid.   The following portions of the patient's history were reviewed and updated as appropriate: allergies, current medications, past family history, past medical history, past social history, past surgical history and problem list.   Objective:   Vitals:   08/27/18 0838  BP: 116/73  Pulse: 90  Weight: 67.2 kg    Fetal Status: Fetal Heart Rate (bpm): NST Fundal Height: 39 cm Movement: Present  Presentation: Vertex  General:  Alert, oriented and cooperative. Patient is in no acute distress.  Skin: Skin is warm and dry. No rash noted.   Cardiovascular: Normal heart rate noted  Respiratory: Normal respiratory effort, no problems with respiration noted  Abdomen: Soft, gravid, appropriate for gestational age.  Pain/Pressure: Present     Pelvic: Cervical exam performed Dilation: 1 Effacement (%): 80 Station: -2  Extremities: Normal range of motion.  Edema: Trace  Mental Status: Normal mood and affect. Normal behavior. Normal judgment and thought content.   Assessment and Plan:  Pregnancy: G1P0 at [redacted]w[redacted]d 1. Encounter for supervision of normal first pregnancy in second trimester --Anticipatory guidance about next visits/weeks of pregnancy given. --Reviewed safety, visitor policy, reassurance about COVID-19 for pregnancy at this time. Discussed possible changes to visits, including televisits, that may occur due to COVID-19.  The office remains open if pt needs to be seen and MAU is open 24 hours/day for OB emergencies.   - Fetal nonstress test; Future. NST reactive.   --IOL scheduled May 1 for postdates at 41  weeks.  Cervix favorable at 1/80/--2, vertex.  Orders signed and held.  Term labor symptoms and general obstetric precautions including but not limited to vaginal bleeding, contractions, leaking of fluid and fetal movement were reviewed in detail with the patient. Please refer to After Visit Summary for other counseling recommendations.   No follow-ups on file.  No future appointments.  Sharen Counter, CNM

## 2018-08-30 ENCOUNTER — Other Ambulatory Visit (HOSPITAL_COMMUNITY): Payer: Self-pay | Admitting: *Deleted

## 2018-08-31 ENCOUNTER — Other Ambulatory Visit: Payer: Self-pay

## 2018-08-31 ENCOUNTER — Inpatient Hospital Stay (HOSPITAL_COMMUNITY): Payer: Medicaid Other

## 2018-08-31 ENCOUNTER — Encounter (HOSPITAL_COMMUNITY): Payer: Self-pay | Admitting: *Deleted

## 2018-08-31 ENCOUNTER — Inpatient Hospital Stay (HOSPITAL_COMMUNITY)
Admission: AD | Admit: 2018-08-31 | Discharge: 2018-09-03 | DRG: 806 | Disposition: A | Discharge status: Home / Self Care | Payer: Medicaid Other | Attending: Obstetrics and Gynecology | Admitting: Obstetrics and Gynecology

## 2018-08-31 DIAGNOSIS — R339 Retention of urine, unspecified: Secondary | ICD-10-CM

## 2018-08-31 DIAGNOSIS — O480 Post-term pregnancy: Secondary | ICD-10-CM | POA: Diagnosis present

## 2018-08-31 DIAGNOSIS — Z3A41 41 weeks gestation of pregnancy: Secondary | ICD-10-CM

## 2018-08-31 LAB — CBC
HCT: 39 % (ref 36.0–46.0)
Hemoglobin: 13.3 g/dL (ref 12.0–15.0)
MCH: 31.9 pg (ref 26.0–34.0)
MCHC: 34.1 g/dL (ref 30.0–36.0)
MCV: 93.5 fL (ref 80.0–100.0)
Platelets: 267 10*3/uL (ref 150–400)
RBC: 4.17 MIL/uL (ref 3.87–5.11)
RDW: 12.5 % (ref 11.5–15.5)
WBC: 13.1 10*3/uL — ABNORMAL HIGH (ref 4.0–10.5)
nRBC: 0 % (ref 0.0–0.2)

## 2018-08-31 LAB — ABO/RH: ABO/RH(D): B POS

## 2018-08-31 LAB — RPR: RPR Ser Ql: NONREACTIVE

## 2018-08-31 LAB — TYPE AND SCREEN
ABO/RH(D): B POS
Antibody Screen: NEGATIVE

## 2018-08-31 MED ORDER — LACTATED RINGERS IV SOLN
INTRAVENOUS | Status: DC
  Administered 2018-08-31: 23:00:00 via INTRAVENOUS
  Administered 2018-08-31: 08:00:00 125 mL/h via INTRAVENOUS
  Administered 2018-09-01: 03:00:00 via INTRAVENOUS

## 2018-08-31 MED ORDER — FENTANYL CITRATE (PF) 100 MCG/2ML IJ SOLN
100.0000 ug | INTRAMUSCULAR | Status: DC | PRN
  Administered 2018-08-31 – 2018-09-01 (×2): 100 ug via INTRAVENOUS
  Filled 2018-08-31 (×2): qty 2

## 2018-08-31 MED ORDER — TERBUTALINE SULFATE 1 MG/ML IJ SOLN: 0.2500 mg | Freq: Once | INTRAMUSCULAR | Status: DC | PRN

## 2018-08-31 MED ORDER — OXYTOCIN 40 UNITS IN NORMAL SALINE INFUSION - SIMPLE MED
2.5000 [IU]/h | INTRAVENOUS | Status: DC
  Filled 2018-08-31: qty 1000

## 2018-08-31 MED ORDER — OXYTOCIN 40 UNITS IN NORMAL SALINE INFUSION - SIMPLE MED
1.0000 m[IU]/min | INTRAVENOUS | Status: DC
  Administered 2018-08-31: 2 m[IU]/min via INTRAVENOUS

## 2018-08-31 MED ORDER — LIDOCAINE HCL (PF) 1 % IJ SOLN: 30.0000 mL | INTRAMUSCULAR | Status: DC | PRN

## 2018-08-31 MED ORDER — OXYCODONE-ACETAMINOPHEN 5-325 MG PO TABS: 1.0000 | ORAL_TABLET | ORAL | Status: DC | PRN

## 2018-08-31 MED ORDER — ONDANSETRON HCL 4 MG/2ML IJ SOLN: 4.0000 mg | Freq: Four times a day (QID) | INTRAMUSCULAR | Status: DC | PRN

## 2018-08-31 MED ORDER — OXYTOCIN BOLUS FROM INFUSION
500.0000 mL | Freq: Once | INTRAVENOUS | Status: AC
  Administered 2018-09-01: 500 mL via INTRAVENOUS

## 2018-08-31 MED ORDER — SOD CITRATE-CITRIC ACID 500-334 MG/5ML PO SOLN: 30.0000 mL | ORAL | Status: DC | PRN

## 2018-08-31 MED ORDER — OXYCODONE-ACETAMINOPHEN 5-325 MG PO TABS: 2.0000 | ORAL_TABLET | ORAL | Status: DC | PRN

## 2018-08-31 MED ORDER — MISOPROSTOL 50MCG HALF TABLET
50.0000 ug | ORAL_TABLET | ORAL | Status: DC
  Administered 2018-08-31 (×2): 50 ug via ORAL
  Filled 2018-08-31 (×2): qty 1

## 2018-08-31 MED ORDER — ACETAMINOPHEN 325 MG PO TABS: 650.0000 mg | ORAL_TABLET | ORAL | Status: DC | PRN

## 2018-08-31 MED ORDER — LACTATED RINGERS IV SOLN: 500.0000 mL | INTRAVENOUS | Status: DC | PRN

## 2018-08-31 NOTE — Progress Notes (Signed)
Patient ID: Cynthia Arnold, female   DOB: [birth year], 23 y.o.   MRN: [id number]  Feeling ctx more  BP 119/73, P 65 FHR 130s, +accels, occ mi variables Ctx irreg 2-5 mins with Pit @ 64mu/min Cx deferred (was 2-3cm/80/vtx -2)  IUP@term  IOL process  -Continue to titrate Pit to keep ctx reg -Anticipate SVD  [name] 08/31/2018

## 2018-08-31 NOTE — Progress Notes (Signed)
LABOR PROGRESS NOTE  Cynthia Arnold is a 23 y.o. G1P0 at [redacted]w[redacted]d admitted for IOL for PD  Subjective: Feeling mild pain with contractions Pain is constant, but intensifies when contractions on monitor  Objective: BP 106/67   Pulse 74   Temp 98.3 F (36.8 C) (Axillary)   Resp 16   Ht 5\' 2"  (1.575 m)   Wt 67.2 kg   LMP 11/17/2017   BMI 27.09 kg/m  or  Vitals:   08/31/18 1800 08/31/18 1841 08/31/18 1900 08/31/18 1933  BP: 112/68 103/63 97/62 106/67  Pulse: 80 83 69 74  Resp: 16 16 16 16   Temp:    98.3 F (36.8 C)  TempSrc:    Axillary  Weight:      Height:        Dilation: 2.5 Effacement (%): 80 Cervical Position: Middle, Anterior Station: -2 Presentation: Vertex Exam by:: Dr.Joshuajames Moehring FHT: baseline rate 130s, moderate varibility, + acel, variable decels Toco: q1-42m  Labs: Lab Results  Component Value Date   WBC 13.1 (H) 08/31/2018   HGB 13.3 08/31/2018   HCT 39.0 08/31/2018   MCV 93.5 08/31/2018   PLT 267 08/31/2018    Patient Active Problem List   Diagnosis Date Noted  . Post term pregnancy at [redacted] weeks gestation 08/31/2018  . Supervision of normal pregnancy 01/30/2018    Assessment / Plan: 23 y.o. G1P0 at [redacted]w[redacted]d here for IOL for PD  Labor: on pitocin with frequent contractions, but baby tolerating well and minimal cervical change. Continue pitocin titration as tolerated.  Fetal Wellbeing:  Cat 2 (reassuring) Pain Control:  Comfortable now. Likely epidural later Anticipated MOD:  SVD  Curlee Bogan,MD OB Fellow  08/31/2018, 7:51 PM

## 2018-08-31 NOTE — Progress Notes (Signed)
   Keity Crespi is a 23 y.o. G1P0 at [redacted]w[redacted]d  admitted for postdates induction  Subjective: Coping well; feeling her contractions a little bit stronger now.   Objective: Vitals:   08/31/18 0837 08/31/18 1015 08/31/18 1200 08/31/18 1400  BP:  110/76 112/72 112/77  Pulse:  91 90 98  Resp:  16 16 16   Temp:      TempSrc:      Weight: 67.2 kg     Height: 5\' 2"  (1.575 m)      No intake/output data recorded.  FHT:  FHR: 130 bpm, variability: moderate,  accelerations:  Present,  decelerations:  Present one deceleration during SVE UC:   irregular, every 2-5 minutes SVE:   Dilation: 2.5 Effacement (%): 80 Station: -2 Exam by:: [employer]   Labs: Lab Results  Component Value Date   WBC 13.1 (H) 08/31/2018   HGB 13.3 08/31/2018   HCT 39.0 08/31/2018   MCV 93.5 08/31/2018   PLT 267 08/31/2018    Assessment / Plan: cervix is thinning out; now 80%, much more anterior; now 2.5.   Labor: early labor, will start pitocin Fetal Wellbeing:  Category I Pain Control:  Labor support without medications Anticipated MOD:  NSVD  [name] 08/31/2018, 4:50 PM

## 2018-08-31 NOTE — H&P (Signed)
LABOR AND DELIVERY ADMISSION HISTORY AND PHYSICAL NOTE  Cynthia Arnold is a 23 y.o. female G1P0 with IUP at [redacted]w[redacted]d by LMP presenting for IOL for PD.  She reports positive fetal movement. She denies leakage of fluid or vaginal bleeding.  Prenatal History/Complications: PNC at [facility] Pregnancy complications:  - None  Past Medical History: History reviewed. No pertinent past medical history.  Past Surgical History: History reviewed. No pertinent surgical history.  Obstetrical History: OB History    Gravida  1   Para      Term      Preterm      AB      Living        SAB      TAB      Ectopic      Multiple      Live Births              Social History: Social History   Socioeconomic History  . Marital status: Single    Spouse name: Not on file  . Number of children: Not on file  . Years of education: Not on file  . Highest education level: Not on file  Occupational History  . Not on file  Social Needs  . Financial resource strain: Not on file  . Food insecurity:    Worry: Not on file    Inability: Not on file  . Transportation needs:    Medical: Not on file    Non-medical: Not on file  Tobacco Use  . Smoking status: Never Smoker  . Smokeless tobacco: Never Used  Substance and Sexual Activity  . Alcohol use: Yes  . Drug use: Not Currently  . Sexual activity: Yes  Lifestyle  . Physical activity:    Days per week: Not on file    Minutes per session: Not on file  . Stress: Not on file  Relationships  . Social connections:    Talks on phone: Not on file    Gets together: Not on file    Attends religious service: Not on file    Active member of club or organization: Not on file    Attends meetings of clubs or organizations: Not on file    Relationship status: Not on file  Other Topics Concern  . Not on file  Social History Narrative  . Not on file    Family History: History reviewed. No pertinent family history.  Allergies: No Known  Allergies  Medications Prior to Admission  Medication Sig Dispense Refill Last Dose  . Prenatal Vit-Fe Fumarate-FA (PRENATAL MULTIVITAMIN) TABS tablet Take 1 tablet by mouth daily at 12 noon.   Taking     Review of Systems  All systems reviewed and negative except as stated in HPI  Physical Exam Blood pressure 115/72, pulse (!) 105, temperature 98.7 F (37.1 C), resp. rate 16, last menstrual period 11/17/2017. General appearance: alert, oriented, NAD Lungs: normal respiratory effort Heart: regular rate Abdomen: soft, non-tender; gravid, FH appropriate for GA Extremities: No calf swelling or tenderness Presentation: cephalic Fetal monitoring: baseline 130/mod var/+ acels/no decels Uterine activity: q5m Dilation: 1 Effacement (%): 60 Station: -3 Exam by:: Kris Hartmann, RN  Prenatal labs: ABO, Rh: --/--/PENDING (05/01 6283) Antibody: PENDING (05/01 1517) Rubella: 2.87 (10/02 1456) RPR: Non Reactive (02/03 1045)  HBsAg: Negative (10/02 1456)  HIV: Non Reactive (02/03 1045)  GC/Chlamydia: Negative GBS: Negative (03/31 0135)  2-hr GTT: 77/97/97 Genetic screening:  Low risk Anatomy US: normal  Prenatal Transfer Tool  Maternal Diabetes: No Genetic Screening: Normal Maternal Ultrasounds/Referrals: Normal Fetal Ultrasounds or other Referrals:  None Maternal Substance Abuse:  No Significant Maternal Medications:  None Significant Maternal Lab Results: None  Results for orders placed or performed during the hospital encounter of 08/31/18 (from the past 24 hour(s))  Type and screen   Collection Time: 08/31/18  7:48 AM  Result Value Ref Range   ABO/RH(D) PENDING    Antibody Screen PENDING    Sample Expiration      09/03/2018 Performed at [facility], [address] [place], [place] [address]   CBC   Collection Time: 08/31/18  8:00 AM  Result Value Ref Range   WBC 13.1 (H) 4.0 - 10.5 K/uL   RBC 4.17 3.87 - 5.11 MIL/uL   Hemoglobin 13.3 12.0 - 15.0 g/dL   HCT  60.439.0 54.036.0 - 98.146.0 %   MCV 93.5 80.0 - 100.0 fL   MCH 31.9 26.0 - 34.0 pg   MCHC 34.1 30.0 - 36.0 g/dL   RDW 19.112.5 47.811.5 - 29.515.5 %   Platelets 267 150 - 400 K/uL   nRBC 0.0 0.0 - 0.2 %    Patient Active Problem List   Diagnosis Date Noted  . Post term pregnancy at [redacted] weeks gestation 08/31/2018  . Supervision of normal pregnancy 01/30/2018    Assessment: Cynthia Arnold is a 23 y.o. G1P0 at 3966w0d here for IOL for PD  #Labor: start induction with cytotec. Will discuss FB placement with patient as well.  #Pain: Desires epidural #FWB: Cat 1 #ID:  GBS (-) #MOF: breast #MOC: declined #Circ: NA  Luisdaniel Kenton,MD 08/31/2018, 8:35 AM

## 2018-08-31 NOTE — Progress Notes (Signed)
   Cynthia Arnold is a 23 y.o. G1P0 at [redacted]w[redacted]d  admitted for IOL for postdates  Subjective: Coping well; no complaints.   Objective: Vitals:   08/31/18 0735 08/31/18 0830 08/31/18 0837  BP: 115/72    Pulse: (!) 105    Resp: 16    Temp:  98.7 F (37.1 C)   TempSrc:  Oral   Weight:   67.2 kg  Height:   5\' 2"  (1.575 m)   No intake/output data recorded.  FHT:  FHR: 140 bpm, variability: moderate,  accelerations:  Present,  decelerations:  Absent UC:   irregular, 4 every  minutes SVE:   Dilation: 1 Effacement (%): 60 Station: -3 Exam by:: Kris Hartmann, RN   Labs: Lab Results  Component Value Date   WBC 13.1 (H) 08/31/2018   HGB 13.3 08/31/2018   HCT 39.0 08/31/2018   MCV 93.5 08/31/2018   PLT 267 08/31/2018    Assessment / Plan: Coping well FB placed; patient tolerated well.  Will do 2nd cytotec   Labor: early labor Fetal Wellbeing:  Category I Pain Control:  Labor support without medications Anticipated MOD:  NSVD  Cynthia Arnold 08/31/2018, 12:54 PM

## 2018-09-01 ENCOUNTER — Inpatient Hospital Stay (HOSPITAL_COMMUNITY): Payer: Medicaid Other | Admitting: Anesthesiology

## 2018-09-01 ENCOUNTER — Encounter (HOSPITAL_COMMUNITY): Payer: Self-pay | Admitting: General Practice

## 2018-09-01 DIAGNOSIS — O480 Post-term pregnancy: Secondary | ICD-10-CM

## 2018-09-01 DIAGNOSIS — Z3A41 41 weeks gestation of pregnancy: Secondary | ICD-10-CM

## 2018-09-01 LAB — CBC
HCT: 37 % (ref 36.0–46.0)
Hemoglobin: 12.9 g/dL (ref 12.0–15.0)
MCH: 32.4 pg (ref 26.0–34.0)
MCHC: 34.9 g/dL (ref 30.0–36.0)
MCV: 93 fL (ref 80.0–100.0)
Platelets: 238 10*3/uL (ref 150–400)
RBC: 3.98 MIL/uL (ref 3.87–5.11)
RDW: 12.4 % (ref 11.5–15.5)
WBC: 27.4 10*3/uL — ABNORMAL HIGH (ref 4.0–10.5)
nRBC: 0 % (ref 0.0–0.2)

## 2018-09-01 MED ORDER — COCONUT OIL OIL: 1.0000 "application " | TOPICAL_OIL | Status: DC | PRN

## 2018-09-01 MED ORDER — PHENYLEPHRINE 40 MCG/ML (10ML) SYRINGE FOR IV PUSH (FOR BLOOD PRESSURE SUPPORT): 80.0000 ug | PREFILLED_SYRINGE | INTRAVENOUS | Status: DC | PRN

## 2018-09-01 MED ORDER — SIMETHICONE 80 MG PO CHEW: 80.0000 mg | CHEWABLE_TABLET | ORAL | Status: DC | PRN

## 2018-09-01 MED ORDER — LACTATED RINGERS IV SOLN
500.0000 mL | Freq: Once | INTRAVENOUS | Status: AC
  Administered 2018-09-01: 500 mL via INTRAVENOUS

## 2018-09-01 MED ORDER — TETANUS-DIPHTH-ACELL PERTUSSIS 5-2.5-18.5 LF-MCG/0.5 IM SUSP: 0.5000 mL | Freq: Once | INTRAMUSCULAR | Status: DC

## 2018-09-01 MED ORDER — ONDANSETRON HCL 4 MG PO TABS: 4.0000 mg | ORAL_TABLET | ORAL | Status: DC | PRN

## 2018-09-01 MED ORDER — BENZOCAINE-MENTHOL 20-0.5 % EX AERO
1.0000 "application " | INHALATION_SPRAY | CUTANEOUS | Status: DC | PRN
  Administered 2018-09-01: 1 via TOPICAL

## 2018-09-01 MED ORDER — AMMONIA AROMATIC IN INHA
RESPIRATORY_TRACT | Status: AC
  Administered 2018-09-01: 3 mL
  Filled 2018-09-01: qty 10

## 2018-09-01 MED ORDER — PRENATAL MULTIVITAMIN CH
1.0000 | ORAL_TABLET | Freq: Every day | ORAL | Status: DC
  Administered 2018-09-01 – 2018-09-03 (×3): 1 via ORAL
  Filled 2018-09-01 (×3): qty 1

## 2018-09-01 MED ORDER — DIPHENHYDRAMINE HCL 25 MG PO CAPS: 25.0000 mg | ORAL_CAPSULE | Freq: Four times a day (QID) | ORAL | Status: DC | PRN

## 2018-09-01 MED ORDER — DIBUCAINE (PERIANAL) 1 % EX OINT
1.0000 "application " | TOPICAL_OINTMENT | CUTANEOUS | Status: DC | PRN
  Administered 2018-09-01: 1 via RECTAL
  Filled 2018-09-01: qty 28

## 2018-09-01 MED ORDER — IBUPROFEN 600 MG PO TABS
600.0000 mg | ORAL_TABLET | Freq: Four times a day (QID) | ORAL | Status: DC
  Administered 2018-09-01 – 2018-09-03 (×10): 600 mg via ORAL
  Filled 2018-09-01 (×10): qty 1

## 2018-09-01 MED ORDER — ONDANSETRON HCL 4 MG/2ML IJ SOLN: 4.0000 mg | INTRAMUSCULAR | Status: DC | PRN

## 2018-09-01 MED ORDER — SODIUM CHLORIDE (PF) 0.9 % IJ SOLN
INTRAMUSCULAR | Status: DC | PRN
  Administered 2018-09-01: 14 mL/h via EPIDURAL

## 2018-09-01 MED ORDER — EPHEDRINE 5 MG/ML INJ: 10.0000 mg | INTRAVENOUS | Status: DC | PRN

## 2018-09-01 MED ORDER — DIPHENHYDRAMINE HCL 50 MG/ML IJ SOLN: 12.5000 mg | INTRAMUSCULAR | Status: DC | PRN

## 2018-09-01 MED ORDER — FENTANYL-BUPIVACAINE-NACL 0.5-0.125-0.9 MG/250ML-% EP SOLN
12.0000 mL/h | EPIDURAL | Status: DC | PRN
  Filled 2018-09-01: qty 250

## 2018-09-01 MED ORDER — ZOLPIDEM TARTRATE 5 MG PO TABS: 5.0000 mg | ORAL_TABLET | Freq: Every evening | ORAL | Status: DC | PRN

## 2018-09-01 MED ORDER — WITCH HAZEL-GLYCERIN EX PADS
1.0000 "application " | MEDICATED_PAD | CUTANEOUS | Status: DC | PRN
  Administered 2018-09-01: 1 via TOPICAL

## 2018-09-01 MED ORDER — METHYLERGONOVINE MALEATE 0.2 MG/ML IJ SOLN
0.2000 mg | Freq: Once | INTRAMUSCULAR | Status: AC
  Administered 2018-09-01: 0.2 mg via INTRAMUSCULAR

## 2018-09-01 MED ORDER — LIDOCAINE-EPINEPHRINE (PF) 2 %-1:200000 IJ SOLN
INTRAMUSCULAR | Status: DC | PRN
  Administered 2018-09-01: 2 mL via EPIDURAL
  Administered 2018-09-01: 3 mL via EPIDURAL

## 2018-09-01 MED ORDER — MEASLES, MUMPS & RUBELLA VAC IJ SOLR: 0.5000 mL | Freq: Once | INTRAMUSCULAR | Status: DC

## 2018-09-01 MED ORDER — ACETAMINOPHEN 325 MG PO TABS: 650.0000 mg | ORAL_TABLET | ORAL | Status: DC | PRN

## 2018-09-01 MED ORDER — METHYLERGONOVINE MALEATE 0.2 MG/ML IJ SOLN
INTRAMUSCULAR | Status: AC
  Filled 2018-09-01: qty 1

## 2018-09-01 MED ORDER — SENNOSIDES-DOCUSATE SODIUM 8.6-50 MG PO TABS
2.0000 | ORAL_TABLET | ORAL | Status: DC
  Administered 2018-09-02 (×2): 2 via ORAL
  Filled 2018-09-01 (×2): qty 2

## 2018-09-01 NOTE — Lactation Note (Signed)
This note was copied from a baby's chart. Lactation Consultation Note  Patient Name: Cynthia Arnold [name] Date: 09/01/2018 Reason for consult: Initial assessment;Primapara;1st time breastfeeding;Term  P1 mother whose infant is now 8 hours old.  RN requested latch assistance.  Mother was holding baby in her arms when I arrived.  Baby was awake but not showing feeding cues.  Mother stated she has been trying to breast feed but was not successful.  Mother's breasts are soft and non tender and nipples are everted but short shafted.  RN had breast shells and manual pump in room.  Reviewed both of these products with mother.  Observed mother doing hand expression and assisted to perfect her form.  She was able to express a couple drops of colostrum which I finger fed back to baby.  Colostrum container provided and milk storage times reviewed.    Assisted baby to latch onto the right breast in the cross cradle hold.  Showed mother how to properly place fingers and hand and how to obtain a deep latch.  Demonstrated breast compressions.  Baby was able to suck on/off for about 10 minutes.  When she pulled off I showed mother how to place her back on the breast and encourage her to continue sucking.  I did not use the NS that was at baby's bedside and baby was able to maintain latch.    Encouraged to feed 8-12 times/24 hours or sooner if baby shows feeding cues.  Mother will call for latch assistance as needed; suggested she do a lot of STS with baby.  Placed baby STS after feeding attempts and she quieted nicely.  RN updated.   Maternal Data Formula Feeding for Exclusion: No Has patient been taught Hand Expression?: Yes Does the patient have breastfeeding experience prior to this delivery?: No  Feeding Feeding Type: Breast Fed  LATCH Score Latch: Repeated attempts needed to sustain latch, nipple held in mouth throughout feeding, stimulation needed to elicit sucking reflex.  Audible Swallowing:  None  Type of Nipple: Everted at rest and after stimulation(short shafted)  Comfort (Breast/Nipple): Soft / non-tender  Hold (Positioning): Assistance needed to correctly position infant at breast and maintain latch.  LATCH Score: 6  Interventions Interventions: Breast feeding basics reviewed;Assisted with latch;Skin to skin;Breast massage;Hand express;Pre-pump if needed;Breast compression;Hand pump;Shells;Position options;Support pillows;Adjust position  Lactation Tools Discussed/Used Tools: Shells;Pump;Nipple Shields Nipple shield size: 20 Shell Type: Inverted Breast pump type: Manual   Consult Status Consult Status: Follow-up Date: 09/02/18 Follow-up type: In-patient    Julyana Woolverton R Erlene Devita 09/01/2018, 6:18 PM

## 2018-09-01 NOTE — Progress Notes (Signed)
Patient reports that she had not voided since delivery, but has the pressure and feeling when she is attempting to void that she needs to; bladder is distended, uterus is firm but to the left and 1/U; upon bladder scanning, is scanned; patient assisted to bathroom without success; straight catheter done with of dark yellow urine emptied. After bladder is emptied, uterus is firm, midline, and U/1. Instructed patient to drink water and attempt to void at least every 2 hours; will continue to monitor.

## 2018-09-01 NOTE — Progress Notes (Signed)
Patient ID: Cynthia Arnold, female   DOB: 05-Mar-1996, 23 y.o.   MRN: [id number]  Legs shaking per pt; rec'd Fentanyl with good relief  BP 113/61, P 71 FHR 130s, +accels, occ variables, Cat 1 Ctx q 2-3 mins with Pit at 31mu/min Cx 3/90/vtx -1; SROM with exam for mod MSF  IUP@term  Latent phase GBS neg  -Keep ctx reg with Pit -Anticipate SVD  [name] CNM 09/01/2018 12:57 AM

## 2018-09-01 NOTE — Anesthesia Procedure Notes (Signed)
Epidural Patient location during procedure: OB Start time: 09/01/2018 1:50 AM End time: 09/01/2018 2:05 AM  Staffing Anesthesiologist: Elmer Picker, MD Performed: anesthesiologist   Preanesthetic Checklist Completed: patient identified, pre-op evaluation, timeout performed, IV checked, risks and benefits discussed and monitors and equipment checked  Epidural Patient position: sitting Prep: site prepped and draped and DuraPrep Patient monitoring: continuous pulse ox, blood pressure, heart rate and cardiac monitor Approach: midline Location: L3-L4 Injection technique: LOR air  Needle:  Needle type: Tuohy  Needle gauge: 17 G Needle length: 9 cm Needle insertion depth: 4 cm Catheter type: closed end flexible Catheter size: 19 Gauge Catheter at skin depth: 10 cm Test dose: negative  Assessment Sensory level: T8 Events: blood not aspirated, injection not painful, no injection resistance, negative IV test and no paresthesia  Additional Notes Patient identified. Risks/Benefits/Options discussed with patient including but not limited to bleeding, infection, nerve damage, paralysis, failed block, incomplete pain control, headache, blood pressure changes, nausea, vomiting, reactions to medication both or allergic, itching and postpartum back pain. Confirmed with bedside nurse the patient's most recent platelet count. Confirmed with patient that they are not currently taking any anticoagulation, have any bleeding history or any family history of bleeding disorders. Patient expressed understanding and wished to proceed. All questions were answered. Sterile technique was used throughout the entire procedure. Please see nursing notes for vital signs. Test dose was given through epidural catheter and negative prior to continuing to dose epidural or start infusion. Warning signs of high block given to the patient including shortness of breath, tingling/numbness in hands, complete motor block, or  any concerning symptoms with instructions to call for help. Patient was given instructions on fall risk and not to get out of bed. All questions and concerns addressed with instructions to call with any issues or inadequate analgesia.  Reason for block:procedure for pain

## 2018-09-01 NOTE — Anesthesia Preprocedure Evaluation (Addendum)

## 2018-09-01 NOTE — Anesthesia Postprocedure Evaluation (Signed)
Anesthesia Post Note  Patient: Cynthia Arnold  Procedure(s) Performed: AN AD HOC LABOR EPIDURAL     Patient location during evaluation: Mother Baby Anesthesia Type: Epidural Level of consciousness: awake and alert Pain management: pain level controlled Vital Signs Assessment: post-procedure vital signs reviewed and stable Respiratory status: spontaneous breathing, nonlabored ventilation and respiratory function stable Cardiovascular status: stable Postop Assessment: no headache, no backache and epidural receding Anesthetic complications: no    Last Vitals:  Vitals:   09/01/18 1001 09/01/18 1030  BP: 126/79 (!) 100/58  Pulse: 81 91  Resp: 16 16  Temp:    SpO2:      Last Pain:  Vitals:   09/01/18 1030  TempSrc:   PainSc: 0-No pain   Pain Goal: Patients Stated Pain Goal: 2 (09/01/18 0305)                 Jannis Atkins

## 2018-09-01 NOTE — Progress Notes (Signed)
Patient ID: Cynthia Arnold, female   DOB: 1995-08-12, 22 y.o.   MRN: [id number]  Comfortable w/ epidural; not feeling pressure  VSS, afebrile FHR 125-130, +accels, occ variables- late onset at times Ctx q 2-4 mins with Pit at 34mu/min Cx C/C/vtx +2  IUP@term  End 1st stage  -Will decrease Pit to 45mu/min to help with occ variables -Check in an hour for urge to push, or start pushing sooner with urge -Anticipate SVD  [name] CNM 09/01/2018 6:18 AM

## 2018-09-01 NOTE — Discharge Summary (Signed)
Obstetrics Discharge Summary [facility]N [facility]   Patient Name: Cynthia Arnold DOB: October 10, 1995 MRN: [id number]  Date of admission: 08/31/2018 Delivering MD: Tamera Stands   Date of discharge: 09/01/2018  Admitting diagnosis: pregnancy Intrauterine pregnancy: [redacted]w[redacted]d     Secondary diagnosis:   Active Problems:   Post term pregnancy at [redacted] weeks gestation   Postpartum hemorrhage    Discharge diagnosis: Term Pregnancy Delivered                               Postpartum procedures: placement of Foley catheter for 24 hours   Outpatient Follow-Up: [ ]  continue to counsel of method of contraception  Hospital course: Cynthia Arnold is a 23 y.o. [redacted]w[redacted]d who was admitted for post-dates induction of labor. Her pregnancy was uncomplicated. Her labor course was notable for induction with FB, cytotec and pitocin as well as by epidural placement and AROM. Delivery was complicated by PPH with EBL of about 1L secondary to stellate laceration that was difficult to repair. Her HgB trended from 13.3 to 10 on PPD#1. Please see delivery/op note for additional details. Her postpartum course was uncomplicated. She was breastfeeding without difficulty.. By day of discharge, she was passing flatus, urinating, eating and drinking without difficulty. Her pain was well-controlled, and she was discharged home with IBU and Tylenol. She will follow-up in clinic in 6 weeks for a virtual visit.   Physical exam  Vitals:   09/01/18 0930 09/01/18 0946 09/01/18 1001 09/01/18 1030  BP: 127/68 120/74 126/79 (!) 100/58  Pulse: 85 80 81 91  Resp: 16 16 16 16   Temp:      TempSrc:      SpO2:      Weight:      Height:       General: normal Lochia: appropriate Uterine Fundus: firm Incision: N/A DVT Evaluation: No evidence of DVT seen on physical exam. Labs: Lab Results  Component Value Date   WBC 13.1 (H) 08/31/2018   HGB 13.3 08/31/2018   HCT 39.0 08/31/2018   MCV 93.5 08/31/2018   PLT 267 08/31/2018   No flowsheet  data found.  Discharge instructions: Per After Visit Summary and "Baby and Me Booklet"  After visit meds:    Postpartum contraception: declines contraception. I rec'd condoms and/or withdrawal for at least 9 months Diet: Routine Diet Activity: Advance as tolerated. Pelvic rest for 6 weeks.   Follow-up Appt:No future appointments. Follow-up Visit:No follow-ups on file. Please schedule this patient for Postpartum visit in: 4-6 weeks with the following provider: Any provider Low risk pregnancy complicated by: none Delivery mode:  SVD Anticipated Birth Control:  other/unsure PP Procedures needed: none  Schedule Integrated BH visit: no   Newborn Data: Live born female  Birth Weight: 6 lb 10.5 oz (3019 g) APGAR: 8, 9  Newborn Delivery   Birth date/time:  09/01/2018 08:22:00 Delivery type:  Vaginal, Spontaneous    Baby Feeding: Bottle and Breast Disposition:home with mother

## 2018-09-02 LAB — CBC
HCT: 30.5 % — ABNORMAL LOW (ref 36.0–46.0)
Hemoglobin: 10.4 g/dL — ABNORMAL LOW (ref 12.0–15.0)
MCH: 32.2 pg (ref 26.0–34.0)
MCHC: 34.1 g/dL (ref 30.0–36.0)
MCV: 94.4 fL (ref 80.0–100.0)
Platelets: 214 10*3/uL (ref 150–400)
RBC: 3.23 MIL/uL — ABNORMAL LOW (ref 3.87–5.11)
RDW: 12.8 % (ref 11.5–15.5)
WBC: 22.4 10*3/uL — ABNORMAL HIGH (ref 4.0–10.5)
nRBC: 0 % (ref 0.0–0.2)

## 2018-09-02 MED ORDER — PHENAZOPYRIDINE HCL 200 MG PO TABS
200.0000 mg | ORAL_TABLET | Freq: Three times a day (TID) | ORAL | Status: DC
  Administered 2018-09-02: 09:00:00 200 mg via ORAL
  Filled 2018-09-02 (×7): qty 1

## 2018-09-02 NOTE — Lactation Note (Addendum)
This note was copied from a baby's chart. Lactation Consultation Note  Patient Name: Cynthia Arnold [id number] Date: 09/02/2018   Dr. Margo Aye was made aware that this Mom recently had her 1st dose of Pyridium. MD is aware that Pyridium is contraindicated during breastfeeding.  Later clarified (around 1030): Dr. Margo Aye is fine with Mom pumping & dumping for 30 hours (5 x half-life of drug). Lurline Hare [facility] 09/02/2018, 10:08 AM

## 2018-09-02 NOTE — Consult Note (Addendum)
Notified mother that due to medication Pyridium she took at [phone], she will need to pump for 30 hours and dispose breast milk. notified by Lactation Selena Batten that medication is contraindicated with breast feeding. Pediatrician is notified and agrees that mother should pump as well as dispose milk for 30 hours. Set up DEBP, went over cleaning and set u . Mother will not take any other doses of medication. Dr. Earlene Plater notified

## 2018-09-02 NOTE — Progress Notes (Signed)
On assessment, patient told nurse she had not really peed since being in m/b.   Nurse got patient up to void; patient very swollen.  Tried several techniques that were unsuccessful.  On fundal check, patient was slightly distended, firm , but to left and 1U.  Patient says she has been drinking water.  Eating now, then plan to do bladder scan.

## 2018-09-02 NOTE — Lactation Note (Signed)
This note was copied from a baby's chart. Lactation Consultation Note  Patient Name: Cynthia Arnold Date: 09/02/2018   I met with parents; Dad had just finished finger-feeding the baby. Mom is pumping & dumping until 30 hours has elapsed from the last Pyridium dosage (which will be at 0630 on Monday, May 4th). I talked to parents about feeding w/Foley cup instead of finger-feeding. They are amenable to doing so. Since infant is full from the most recent feeding, I discussed how to do it & spoke to the RN. Cynthia Maroon, RN feels comfortable in assisting with cup feeding. In case parents choose to stick with finger-feeding, I provided a measuring cup so that they can properly measure before using the unmarked curved-tip syringe.   To help prepare Mom for latching tomorrow, specifics of an asymmetric latch were shown via [name]. Mom's nipples appear short-shafted & she had been provided a size 20 nipple shield. If infant needed enticed with latching, I showed them how to prefill the nipple shield. However, on further chart review, the last [name] wrote that a nipple shield was not needed to maintain latch.   Parents know how to wash pump parts. Mom shown how to assemble & use hand pump (single- & double-mode) that was included in pump kit. For home use, Mom has an [name] pump which has the ability to have suction up to 371mHg.   [name]5/07/2018, 3:38 PM

## 2018-09-02 NOTE — Progress Notes (Signed)
POSTPARTUM PROGRESS NOTE  Post Partum Day 1  Subjective:  Cynthia Arnold is a 23 y.o. G1P1001 s/p SVD at [redacted]w[redacted]d.  She reports she is doing well.  She denies any problems with ambulating or po intake. Denies nausea or vomiting.  Pain is moderately controlled, feeling a lot of pressure in her pelvis.  Lochia is appropriate. Overnight, patient had difficultly with voiding, bladder scan revealed 640 mL and patient was unable to urinate. In and out catheter done with 750 mL of urine emptied. Patient reports that she is still struggling to void. She is passing gas.   Objective: Blood pressure 108/66, pulse 92, temperature 98.7 F (37.1 C), resp. rate 18, height 5\' 2"  (1.575 m), weight 67.2 kg, last menstrual period 11/17/2017, SpO2 96 %, unknown if currently breastfeeding.  Physical Exam:  General: alert, cooperative and no distress Chest: no respiratory distress Heart:regular rate, distal pulses intact Abdomen: lower abdomen feels distended, uterine fundus firm  Uterine Fundus: firm, appropriately tender DVT Evaluation: No calf swelling or tenderness Extremities: No LE edema Skin: warm, dry  Recent Labs    09/01/18 1230 09/02/18 0500  HGB 12.9 10.4*  HCT 37.0 30.5*    Assessment/Plan: Cynthia Arnold is a 23 y.o. G1P1001 s/p SVD at [redacted]w[redacted]d   PPD#1 - Doing well  Routine postpartum care Struggling to void. RN will bladder scan and continue to monitor UOP.  HgB stable after PPH 1L from laceration, asymptomatic.  Contraception: Undecided  Feeding: Breast  Dispo: Plan for discharge PPD#2.   LOS: 2 days   Marcy Siren, D.O. OB Fellow  09/02/2018, 8:10 AM

## 2018-09-02 NOTE — Progress Notes (Addendum)
3664, This nurse was given a verbal order to notify physician if patient is able to void and notify physician if patient is still distended. Patient was using the toilet upon arrival to her room at approximately 0752, She was able to void about 650 ml of urine, however, patient was still distended and fundus was deviated to the left. Bladder scanned patient and patient had about of retained urine according to bladder scanner. MD notified and verbal order was given to place a foley catheter. Urine output after placement of foley catheter was 500 at about 0845 and another 500 at 0930 .  Fundus is firm, midline and 1 below.Will continue to monitor patient urine output.

## 2018-09-03 ENCOUNTER — Ambulatory Visit: Payer: Medicaid Other | Admitting: Obstetrics and Gynecology

## 2018-09-03 MED ORDER — FLAVOXATE HCL 100 MG PO TABS: 100.0000 mg | ORAL_TABLET | Freq: Three times a day (TID) | ORAL | 0 refills | Status: DC | PRN

## 2018-09-03 MED ORDER — FLAVOXATE HCL 100 MG PO TABS
100.0000 mg | ORAL_TABLET | Freq: Three times a day (TID) | ORAL | Status: DC | PRN
  Administered 2018-09-03: 100 mg via ORAL
  Filled 2018-09-03 (×2): qty 1

## 2018-09-03 MED ORDER — IBUPROFEN 600 MG PO TABS: 600.0000 mg | ORAL_TABLET | Freq: Four times a day (QID) | ORAL | 0 refills | Status: DC

## 2018-09-03 MED ORDER — ACETAMINOPHEN 325 MG PO TABS: 650.0000 mg | ORAL_TABLET | ORAL | 1 refills | Status: AC | PRN

## 2018-09-03 NOTE — Lactation Note (Signed)
This note was copied from a baby's chart. Lactation Consultation Note  Patient Name: Cynthia Arnold DVVOH'Y Date: 09/03/2018 Reason for consult: Follow-up assessment Mom was told to pump and dump until 0930 today.  This is 30 hours from last dose of pyridium.  Baby is receiving cupfeeding now.  Instructed to call for latch assist with next feeding.  Mom has been pumping every 3 hours and obtaining drops.  Maternal Data    Feeding Feeding Type: Formula  LATCH Score                   Interventions    Lactation Tools Discussed/Used     Consult Status Consult Status: Follow-up Date: 09/03/18 Follow-up type: In-patient    Huston Foley 09/03/2018, 8:58 AM

## 2018-09-03 NOTE — Lactation Note (Signed)
This note was copied from a baby's chart. Lactation Consultation Note  Patient Name: Cynthia Arnold Date: 09/03/2018 Reason for consult: Follow-up assessment Mom called out for feeding assist.  Baby is sleeping in crib but FOB thought he saw a few feeding cues.  Assisted with positioning baby in football hold.  Mom has flat nipples.  Shells in room but not wearing them.  Baby opened mouth but unable to grasp breast.  20 mm nipple shield applied.  Baby suckled off and on for 5 minutes and then fell asleep.  Unable to wake baby with stimulation.  Recommended she give baby more time to become hungry and then put baby on with shield.  Discussed milk coming to volume and the prevention and treatment of engorgement.  Instructed to breastfeed with cues using shield and continue to supplement with expressed milk/formula until milk comes in.  Recommended a bottle with a slow flow nipple instead of a cup at this point.  Assisted with putting shells on.  Mom has a breast pump at home.  Stressed importance of pumping every 3 hours to establish and maintain milk supply.  Encouraged to call for assist prior to discharge  Maternal Data    Feeding Feeding Type: Breast Fed  LATCH Score Latch: Repeated attempts needed to sustain latch, nipple held in mouth throughout feeding, stimulation needed to elicit sucking reflex.  Audible Swallowing: None  Type of Nipple: Flat  Comfort (Breast/Nipple): Soft / non-tender  Hold (Positioning): Assistance needed to correctly position infant at breast and maintain latch.  LATCH Score: 5  Interventions Interventions: Assisted with latch;Breast compression;Shells;Adjust position;Breast massage;Support pillows;Position options;DEBP  Lactation Tools Discussed/Used Tools: Shells;Nipple Shields Nipple shield size: 20   Consult Status Consult Status: Complete Date: 09/03/18 Follow-up type: Call as needed    Huston Foley 09/03/2018, 11:20 AM

## 2018-09-03 NOTE — Discharge Instructions (Signed)
Vaginal Delivery, Care After °Refer to this sheet in the next few weeks. These instructions provide you with information about caring for yourself after vaginal delivery. Your health care provider may also give you more specific instructions. Your treatment has been planned according to current medical practices, but problems sometimes occur. Call your health care provider if you have any problems or questions. °What can I expect after the procedure? °After vaginal delivery, it is common to have: °· Some bleeding from your vagina. °· Soreness in your abdomen, your vagina, and the area of skin between your vaginal opening and your anus (perineum). °· Pelvic cramps. °· Fatigue. °Follow these instructions at home: °Medicines °· Take over-the-counter and prescription medicines only as told by your health care provider. °· If you were prescribed an antibiotic medicine, take it as told by your health care provider. Do not stop taking the antibiotic until it is finished. °Driving ° °· Do not drive or operate heavy machinery while taking prescription pain medicine. °· Do not drive for 24 hours if you received a sedative. °Lifestyle °· Do not drink alcohol. This is especially important if you are breastfeeding or taking medicine to relieve pain. °· Do not use tobacco products, including cigarettes, chewing tobacco, or e-cigarettes. If you need help quitting, ask your health care provider. °Eating and drinking °· Drink at least 8 eight-ounce glasses of water every day unless you are told not to by your health care provider. If you choose to breastfeed your baby, you may need to drink more water than this. °· Eat high-fiber foods every day. These foods may help prevent or relieve constipation. High-fiber foods include: °? Whole grain cereals and breads. °? Brown rice. °? Beans. °? Fresh fruits and vegetables. °Activity °· Return to your normal activities as told by your health care provider. Ask your health care provider what  activities are safe for you. °· Rest as much as possible. Try to rest or take a nap when your baby is sleeping. °· Do not lift anything that is heavier than your baby or 10 lb (4.5 kg) until your health care provider says that it is safe. °· Talk with your health care provider about when you can engage in sexual activity. This may depend on your: °? Risk of infection. °? Rate of healing. °? Comfort and desire to engage in sexual activity. °Vaginal Care °· If you have an episiotomy or a vaginal tear, check the area every day for signs of infection. Check for: °? More redness, swelling, or pain. °? More fluid or blood. °? Warmth. °? Pus or a bad smell. °· Do not use tampons or douches until your health care provider says this is safe. °· Watch for any blood clots that may pass from your vagina. These may look like clumps of dark red, brown, or black discharge. °General instructions °· Keep your perineum clean and dry as told by your health care provider. °· Wear loose, comfortable clothing. °· Wipe from front to back when you use the toilet. °· Ask your health care provider if you can shower or take a bath. If you had an episiotomy or a perineal tear during labor and delivery, your health care provider may tell you not to take baths for a certain length of time. °· Wear a bra that supports your breasts and fits you well. °· If possible, have someone help you with household activities and help care for your baby for at least a few days after you   leave the hospital. °· Keep all follow-up visits for you and your baby as told by your health care provider. This is important. °Contact a health care provider if: °· You have: °? Vaginal discharge that has a bad smell. °? Difficulty urinating. °? Pain when urinating. °? A sudden increase or decrease in the frequency of your bowel movements. °? More redness, swelling, or pain around your episiotomy or vaginal tear. °? More fluid or blood coming from your episiotomy or vaginal  tear. °? Pus or a bad smell coming from your episiotomy or vaginal tear. °? A fever. °? A rash. °? Little or no interest in activities you used to enjoy. °? Questions about caring for yourself or your baby. °· Your episiotomy or vaginal tear feels warm to the touch. °· Your episiotomy or vaginal tear is separating or does not appear to be healing. °· Your breasts are painful, hard, or turn red. °· You feel unusually sad or worried. °· You feel nauseous or you vomit. °· You pass large blood clots from your vagina. If you pass a blood clot from your vagina, save it to show to your health care provider. Do not flush blood clots down the toilet without having your health care provider look at them. °· You urinate more than usual. °· You are dizzy or light-headed. °· You have not breastfed at all and you have not had a menstrual period for 12 weeks after delivery. °· You have stopped breastfeeding and you have not had a menstrual period for 12 weeks after you stopped breastfeeding. °Get help right away if: °· You have: °? Pain that does not go away or does not get better with medicine. °? Chest pain. °? Difficulty breathing. °? Blurred vision or spots in your vision. °? Thoughts about hurting yourself or your baby. °· You develop pain in your abdomen or in one of your legs. °· You develop a severe headache. °· You faint. °· You bleed from your vagina so much that you fill two sanitary pads in one hour. °This information is not intended to replace advice given to you by your health care provider. Make sure you discuss any questions you have with your health care provider. °Document Released: 04/15/2000 Document Revised: 09/30/2015 Document Reviewed: 05/03/2015 °Elsevier Interactive Patient Education © 2019 Else[employer] Inc[employer] ° °

## 2018-09-10 ENCOUNTER — Inpatient Hospital Stay (HOSPITAL_COMMUNITY)
Admission: AD | Admit: 2018-09-10 | Discharge: 2018-09-10 | Disposition: A | Discharge status: Home / Self Care | Payer: Medicaid Other | Attending: Obstetrics and Gynecology | Admitting: Obstetrics and Gynecology

## 2018-09-10 ENCOUNTER — Encounter (HOSPITAL_COMMUNITY): Payer: Self-pay | Admitting: *Deleted

## 2018-09-10 ENCOUNTER — Ambulatory Visit: Payer: Medicaid Other

## 2018-09-10 ENCOUNTER — Other Ambulatory Visit: Payer: Self-pay

## 2018-09-10 VITALS — BP 115/74 | HR 91 | Wt 130.1 lb

## 2018-09-10 DIAGNOSIS — R339 Retention of urine, unspecified: Secondary | ICD-10-CM

## 2018-09-10 DIAGNOSIS — R338 Other retention of urine: Secondary | ICD-10-CM | POA: Diagnosis not present

## 2018-09-10 DIAGNOSIS — O9089 Other complications of the puerperium, not elsewhere classified: Secondary | ICD-10-CM

## 2018-09-10 HISTORY — DX: Other specified health status: Z78.9

## 2018-09-10 NOTE — MAU Provider Note (Signed)
Chief Complaint: Foley Catheter Insertion and Urinary Retention   First Provider Initiated Contact with Patient 09/10/18 1203     SUBJECTIVE HPI: Cynthia Arnold is a 23 y.o. G1P1001 at 9 days postpartum who presents to Maternity Admissions from the office for foley catheter insertion.  Patient has had catheter in place since after her delivery d/t urinary retention. Was in the office today for check up after taking the catheter out this morning. Was unable to void in the office after drinking 3 cups of water.   Past Medical History:  Diagnosis Date  . Medical history non-contributory    OB History  Gravida Para Term Preterm AB Living  1 1 1     1   SAB TAB Ectopic Multiple Live Births        0 1    # Outcome Date GA Lbr Len/2nd Weight Sex Delivery Anes PTL Lv  1 Term 09/01/18 9668w1d / 02:12 3019 g F Vag-Spont EPI  LIV     Birth Comments: WNL   Past Surgical History:  Procedure Laterality Date  . NO PAST SURGERIES     Social History   Socioeconomic History  . Marital status: Single    Spouse name: Not on file  . Number of children: Not on file  . Years of education: Not on file  . Highest education level: Not on file  Occupational History  . Not on file  Social Needs  . Financial resource strain: Not on file  . Food insecurity:    Worry: Not on file    Inability: Not on file  . Transportation needs:    Medical: Not on file    Non-medical: Not on file  Tobacco Use  . Smoking status: Never Smoker  . Smokeless tobacco: Never Used  Substance and Sexual Activity  . Alcohol use: Yes  . Drug use: Not Currently  . Sexual activity: Not Currently  Lifestyle  . Physical activity:    Days per week: Not on file    Minutes per session: Not on file  . Stress: Not on file  Relationships  . Social connections:    Talks on phone: Not on file    Gets together: Not on file    Attends religious service: Not on file    Active member of club or organization: Not on file    Attends  meetings of clubs or organizations: Not on file    Relationship status: Not on file  . Intimate partner violence:    Fear of current or ex partner: Not on file    Emotionally abused: Not on file    Physically abused: Not on file    Forced sexual activity: Not on file  Other Topics Concern  . Not on file  Social History Narrative  . Not on file   Family History  Problem Relation Age of Onset  . Healthy Mother   . Healthy Father    No current facility-administered medications on file prior to encounter.    Current Outpatient Medications on File Prior to Encounter  Medication Sig Dispense Refill  . flavoxATE (URISPAS) 100 MG tablet Take 1 tablet (100 mg total) by mouth 3 (three) times daily as needed for bladder spasms. 30 tablet 0  . ibuprofen (ADVIL) 600 MG tablet Take 1 tablet (600 mg total) by mouth every 6 (six) hours. 30 tablet 0  . acetaminophen (TYLENOL) 325 MG tablet Take 2 tablets (650 mg total) by mouth every 4 (four) hours as needed (  for pain scale < 4). (Patient not taking: Reported on 09/10/2018) 90 tablet 1  . Prenatal Vit-Fe Fumarate-FA (PRENATAL MULTIVITAMIN) TABS tablet Take 1 tablet by mouth daily at 12 noon.     No Known Allergies  I have reviewed patient's Past Medical Hx, Surgical Hx, Family Hx, Social Hx, medications and allergies.   Review of Systems  Constitutional: Negative.   Gastrointestinal: Negative.   Genitourinary: Positive for difficulty urinating. Negative for decreased urine volume, dysuria, hematuria and urgency.    OBJECTIVE Patient Vitals for the past 24 hrs:  BP Temp Temp src Pulse Resp SpO2 Height Weight  09/10/18 1300 112/74 (!) 97.5 F (36.4 C) Oral 77 20 100 % - -  09/10/18 1139 120/79 98 F (36.7 C) Oral 84 18 100 % 5\' 1"  (1.549 m) 59.7 kg   Constitutional: Well-developed, well-nourished female in no acute distress.  Cardiovascular: normal rate & rhythm, no murmur Respiratory: normal rate and effort. Lung sounds clear  throughout GI: Abd soft, non-tender, Pos BS x 4. No guarding or rebound tenderness MS: Extremities nontender, no edema, normal ROM Neurologic: Alert and oriented x 4.      LAB RESULTS No results found for this or any previous visit (from the past 24 hour(s)).  IMAGING No results found.  MAU COURSE Orders Placed This Encounter  Procedures  . Insert urethral catheter Catheter type: foley; Reason for Insertion: Acute urinary retention  . Bladder scan  . Discharge patient   No orders of the defined types were placed in this encounter.   MDM Bladder scan performed when patient roomed & showed 265 cc of urine.  Patient voided 3 times on her own & still had >250 noted on bladder scan. C/w Dr. Earlene Plater, will continue with reinsertion of foley catheter.  Per RN, 700 cc of output after placement of catheter. Pt to f/u in office later this week.   ASSESSMENT 1. Acute urinary retention   2. Postpartum complication     PLAN Discharge home in stable condition. Cath care info provided Msg sent to [facility] for f/u in office by end of week  Follow-up Information    [facility] Follow up.   Specialty:  Obstetrics and Gynecology Why:  the office will call you to schedule a follow up appointment later this week Contact information: [address] [place] [address] [phone]         Allergies as of 09/10/2018   No Known Allergies     Medication List    TAKE these medications   acetaminophen 325 MG tablet Commonly known as:  Tylenol Take 2 tablets (650 mg total) by mouth every 4 (four) hours as needed (for pain scale < 4).   flavoxATE 100 MG tablet Commonly known as:  URISPAS Take 1 tablet (100 mg total) by mouth 3 (three) times daily as needed for bladder spasms.   ibuprofen 600 MG tablet Commonly known as:  ADVIL Take 1 tablet (600 mg total) by mouth every 6 (six) hours.   prenatal multivitamin Tabs tablet Take  1 tablet by mouth daily at 12 noon.        Judeth Horn, NP 09/10/2018  3:04 PM

## 2018-09-10 NOTE — Progress Notes (Signed)
Cynthia Spire, NP @ bedside, bladder scan done again after pt voided a second time.  Bladder scan 252 cc.

## 2018-09-10 NOTE — MAU Note (Signed)
Pt sent from office for foley catheter insertion.  Pt having issues with urinary retention.  Reports has urge to void now

## 2018-09-10 NOTE — Progress Notes (Signed)
Bladder scanned after voiding.  369cc noted per scanner

## 2018-09-10 NOTE — Discharge Instructions (Signed)

## 2018-09-10 NOTE — Progress Notes (Signed)
Bladder scanner performed by Renne Crigler, NT.  265 cc noted.  Pt instructed void, if voids will re perform bladder scan to evaluate

## 2018-09-10 NOTE — Progress Notes (Signed)
Pt had vaginal delivery on 09-01-18 and D/C with catheter, she is in the office today for voiding trial. Pt states that she removed catheter this morning at 9:10 and had . S/w dr. Jolayne Panther and was advised to give pt water and time to attempt to void. Pt had 3 cups of water and was unable to void by 11am, provider advised for pt to return to MAU for another cath, pt agreed.

## 2018-09-10 NOTE — Progress Notes (Signed)
Pt reports able to void, will rescan bladder.  Multiple readings obtained, 375cc, 447cc, and 384cc.  Will talk to provider regarding foley catheter insertion.

## 2018-09-11 NOTE — Progress Notes (Signed)
I have reviewed the chart and agree with nursing staff's documentation of this patient's encounter.  Catalina Antigua, MD 09/11/2018 9:20 AM

## 2018-09-13 ENCOUNTER — Encounter: Payer: Self-pay | Admitting: Obstetrics and Gynecology

## 2018-09-13 ENCOUNTER — Other Ambulatory Visit: Payer: Self-pay

## 2018-09-13 ENCOUNTER — Ambulatory Visit (INDEPENDENT_AMBULATORY_CARE_PROVIDER_SITE_OTHER): Payer: Medicaid Other | Admitting: Obstetrics and Gynecology

## 2018-09-13 DIAGNOSIS — R339 Retention of urine, unspecified: Secondary | ICD-10-CM

## 2018-09-13 HISTORY — DX: Retention of urine, unspecified: R33.9

## 2018-09-13 MED ORDER — CEPHALEXIN 500 MG PO CAPS: 500.0000 mg | ORAL_CAPSULE | Freq: Two times a day (BID) | ORAL | 0 refills | Status: DC

## 2018-09-13 NOTE — Progress Notes (Signed)
Cynthia Arnold presents for removal of foley and bladder training. She is S/P TSVD on 08/31/18  Complicated by vaginal laceration resulting in urinary retention and foley placement Pt present 1 week PP for removal of foley. She was unable to void in the office and was sent to MAU. She was able to void several times in the MAU but had PVR's > 250 cc by bladder scan. Foley was replaced.  She has no complaints today. Pain controlled. Tolerating diet. Bottle feeding  PE AF VSS Lung clear Heart RRR Abd soft + BS GU lacerations healing well  Bladder training: 180 cc NS was instilled to bladder ( pt experience discomfort thus stopped at 180 cc) Foley was then removed Pt was able to void only 50 cc after 1 hr. I & O cath completed with PVR of 500 cc  Pt was instructed on self cat. She demonstrated to the nurse her ability to self cath x 2 without problems   A/P Bladder training   Pt instructed to void q 2 hrs while awake. Self cath q 4 hrs. Instructed to record voided and PVR amounts. Instructed not to go longer than 4 hrs without attempting to void. Pt instructed she may discontinue self cath when PVR amount is < 100 cc x 12 hrs. She will be placed on Keflex. Instructed to continue with Urispaz and Motrin. Instructed to go to MAU with any problems or concerns F/U with me in 1 week.

## 2018-09-14 ENCOUNTER — Telehealth: Payer: Self-pay

## 2018-09-14 NOTE — Telephone Encounter (Signed)
Called patient to follow up from yesterday's visit and to see how she was doing. Pt did not answer. LM on VM for pt to return call.

## 2018-09-19 ENCOUNTER — Other Ambulatory Visit: Payer: Self-pay

## 2018-09-19 ENCOUNTER — Ambulatory Visit (INDEPENDENT_AMBULATORY_CARE_PROVIDER_SITE_OTHER): Payer: Medicaid Other | Admitting: Obstetrics and Gynecology

## 2018-09-19 ENCOUNTER — Encounter: Payer: Self-pay | Admitting: Obstetrics and Gynecology

## 2018-09-19 VITALS — BP 106/73 | HR 87 | Wt 128.0 lb

## 2018-09-19 DIAGNOSIS — R339 Retention of urine, unspecified: Secondary | ICD-10-CM

## 2018-09-19 MED ORDER — FLAVOXATE HCL 100 MG PO TABS: 100.0000 mg | ORAL_TABLET | Freq: Three times a day (TID) | ORAL | 0 refills | Status: DC | PRN

## 2018-09-19 MED ORDER — CEPHALEXIN 500 MG PO CAPS: 500.0000 mg | ORAL_CAPSULE | Freq: Two times a day (BID) | ORAL | 0 refills | Status: AC

## 2018-09-19 NOTE — Progress Notes (Signed)
Cynthia Arnold is here for f/u of bladder training d/t to high PVR. Pt is voiding spontaneously and is now also feeling the urge to void. PVR are down to 150-200 cc.  PE AF VSS Lungs clear Heart RRR Abd soft + BS GU deffered  A/P Increased PVR Will renew Keflex and Urispaz for 7 days Instructed to void as needed. Continue with I & O cath q 4 hrs until PVR are 100 cc or < or for the next 5 days which every comes first. Keep PP visit on 10/01/18

## 2018-09-19 NOTE — Progress Notes (Signed)
Patient here for 1 week  F/U as directed from 09/13/18 CC: notes pelvic pain 2/10x .completed antibiotic  medication as directed.

## 2018-09-20 ENCOUNTER — Telehealth: Payer: Self-pay | Admitting: *Deleted

## 2018-09-20 NOTE — Telephone Encounter (Signed)
Call from pharmacy to clarify Keflex Rx. Rx states to take 1 PO BID for 7 days with a dis:21  Pharmacy need clarification on Dispense:  21 or 14??

## 2018-09-25 ENCOUNTER — Telehealth: Payer: Self-pay | Admitting: *Deleted

## 2018-09-25 NOTE — Telephone Encounter (Signed)
Pharmacy needed clarification on Keflex Rx. Per MD note, states Keflex x 7 days.  Verify with pharmacy and to dispense 14 caps.

## 2018-09-26 NOTE — Telephone Encounter (Signed)
Disp # 14

## 2018-10-01 ENCOUNTER — Ambulatory Visit: Payer: Medicaid Other | Admitting: Advanced Practice Midwife

## 2018-10-03 ENCOUNTER — Other Ambulatory Visit: Payer: Self-pay

## 2018-10-03 ENCOUNTER — Ambulatory Visit (INDEPENDENT_AMBULATORY_CARE_PROVIDER_SITE_OTHER): Payer: Medicaid Other

## 2018-10-03 DIAGNOSIS — Z1389 Encounter for screening for other disorder: Secondary | ICD-10-CM

## 2018-10-03 NOTE — Progress Notes (Signed)
TELEHEALTH POSTPARTUM VIRTUAL VIDEO VISIT ENCOUNTER NOTE  Provider location: Center for [facility] [place]   I connected with Cynthia Arnold on 10/03/18 at  1:45 PM EDT by WebEx Video Encounter at home and verified that I am speaking with the correct person using two identifiers.    I discussed the limitations, risks, security and privacy concerns of performing an evaluation and management service by telephone and the availability of in person appointments. I also discussed with the patient that there may be a patient responsible charge related to this service. The patient expressed understanding and agreed to proceed.  Appointment Date: 10/03/2018  OBGYN Clinic: [facility]  Chief Complaint: Postpartum Visit  History of Present Illness: Cynthia Arnold is a 23 y.o. Asian G1P1001 being evaluated for postpartum followup.    She is s/p normal spontaneous vaginal delivery on 08/31/18 at 41 weeks; she was discharged to home on PPD#2 . Pregnancy complicated by n/a. Baby is doing well.  Complains of still feeling occasional bladder spasms. She states she does feel the urge to urinate and is able to go on her own now  Vaginal bleeding or discharge: No  Intercourse: No  Contraception: no method Mode of feeding infant: Bottle PP depression s/s: No . (score 5) Any bowel or bladder issues: No  Pap smear: no abnormalities (date: 01/31/18)  Review of Systems: Positive for bladder spasms. Her 12 point review of systems is negative or as noted in the History of Present Illness.  Patient Active Problem List   Diagnosis Date Noted  . Urinary retention 09/13/2018    Medications Cynthia Arnold had no medications administered during this visit. Current Outpatient Medications  Medication Sig Dispense Refill  . flavoxATE (URISPAS) 100 MG tablet Take 1 tablet (100 mg total) by mouth 3 (three) times daily as needed for bladder spasms. 30 tablet 0  . acetaminophen (TYLENOL) 325 MG tablet Take 2 tablets  (650 mg total) by mouth every 4 (four) hours as needed (for pain scale < 4). (Patient not taking: Reported on 09/10/2018) 90 tablet 1  . ibuprofen (ADVIL) 600 MG tablet Take 1 tablet (600 mg total) by mouth every 6 (six) hours. (Patient not taking: Reported on 10/03/2018) 30 tablet 0  . Prenatal Vit-Fe Fumarate-FA (PRENATAL MULTIVITAMIN) TABS tablet Take 1 tablet by mouth daily at 12 noon.     No current facility-administered medications for this visit.     Allergies Patient has no known allergies.  Physical Exam:   General:  Alert, oriented and cooperative. Patient is in no acute distress.  Mental Status: Normal mood and affect. Normal behavior. Normal judgment and thought content.   Respiratory: Normal respiratory effort noted, no problems with respiration noted  Rest of physical exam deferred due to type of encounter  PP Depression Screening:   Edinburgh Postnatal Depression Scale - 10/03/18 1341      Edinburgh Postnatal Depression Scale:  In the Past 7 Days   I have been able to laugh and see the funny side of things.  0    I have looked forward with enjoyment to things.  0    I have blamed myself unnecessarily when things went wrong.  2    I have been anxious or worried for no good reason.  0    I have felt scared or panicky for no good reason.  0    Things have been getting on top of me.  2    I have been so unhappy that  I have had difficulty sleeping.  0    I have felt sad or miserable.  0    I have been so unhappy that I have been crying.  1    The thought of harming myself has occurred to me.  0    Edinburgh Postnatal Depression Scale Total  5       Assessment:Patient is a 23 y.o. G1P1001 who is 4 weeks postpartum from a normal spontaneous vaginal delivery.  She is doing well.   Plan: 1. Postpartum care and examination   - Patient doing well PP - Reports being able to urinate on her own but still feeling occasional bladder spasms. States they are less frequent than  before. Instructed to continue to monitor and to call office if it gets worse or does not continue to improve -Declines any contraception at this time.    RTC 1 year or sooner if needed  I discussed the assessment and treatment plan with the patient. The patient was provided an opportunity to ask questions and all were answered. The patient agreed with the plan and demonstrated an understanding of the instructions.   The patient was advised to call back or seek an in-person evaluation/go to the ED for any concerning postpartum symptoms.  I provided 10 minutes of face-to-face time during this encounter.   Rolm Bookbinderaroline M Zareen Jamison, CNM [facility]

## 2019-05-19 ENCOUNTER — Emergency Department (HOSPITAL_COMMUNITY): Payer: Medicaid Other

## 2019-05-19 ENCOUNTER — Emergency Department (HOSPITAL_COMMUNITY)
Admission: EM | Admit: 2019-05-19 | Discharge: 2019-05-19 | Disposition: A | Discharge status: Home / Self Care | Payer: Medicaid Other | Attending: Emergency Medicine | Admitting: Emergency Medicine

## 2019-05-19 ENCOUNTER — Encounter (HOSPITAL_COMMUNITY): Payer: Self-pay | Admitting: *Deleted

## 2019-05-19 ENCOUNTER — Other Ambulatory Visit: Payer: Self-pay

## 2019-05-19 DIAGNOSIS — R079 Chest pain, unspecified: Secondary | ICD-10-CM

## 2019-05-19 DIAGNOSIS — R0602 Shortness of breath: Secondary | ICD-10-CM | POA: Diagnosis not present

## 2019-05-19 DIAGNOSIS — R000 Tachycardia, unspecified: Secondary | ICD-10-CM | POA: Diagnosis not present

## 2019-05-19 DIAGNOSIS — Z79899 Other long term (current) drug therapy: Secondary | ICD-10-CM | POA: Insufficient documentation

## 2019-05-19 DIAGNOSIS — R0789 Other chest pain: Secondary | ICD-10-CM | POA: Diagnosis not present

## 2019-05-19 DIAGNOSIS — Z20822 Contact with and (suspected) exposure to covid-19: Secondary | ICD-10-CM | POA: Diagnosis not present

## 2019-05-19 LAB — I-STAT BETA HCG BLOOD, ED (MC, WL, AP ONLY): I-stat hCG, quantitative: <5 m[IU]/mL (ref <5)

## 2019-05-19 LAB — CBC
HCT: 40.9 % (ref 36.0–46.0)
Hemoglobin: 13.8 g/dL (ref 12.0–15.0)
MCH: 30.5 pg (ref 26.0–34.0)
MCHC: 33.7 g/dL (ref 30.0–36.0)
MCV: 90.5 fL (ref 80.0–100.0)
Platelets: 338 10*3/uL (ref 150–400)
RBC: 4.52 MIL/uL (ref 3.87–5.11)
RDW: 11.8 % (ref 11.5–15.5)
WBC: 6.9 10*3/uL (ref 4.0–10.5)
nRBC: 0 % (ref 0.0–0.2)

## 2019-05-19 LAB — BASIC METABOLIC PANEL
Anion gap: 7 (ref 5–15)
BUN: 9 mg/dL (ref 6–20)
CO2: 21 mmol/L — ABNORMAL LOW (ref 22–32)
Calcium: 9 mg/dL (ref 8.9–10.3)
Chloride: 108 mmol/L (ref 98–111)
Creatinine, Ser: 0.62 mg/dL (ref 0.44–1.00)
GFR calc Af Amer: >60 mL/min (ref >60)
GFR calc non Af Amer: >60 mL/min (ref >60)
Glucose, Bld: 98 mg/dL (ref 70–99)
Potassium: 4 mmol/L (ref 3.5–5.1)
Sodium: 136 mmol/L (ref 135–145)

## 2019-05-19 LAB — TROPONIN I (HIGH SENSITIVITY)
Troponin I (High Sensitivity): <2 ng/L (ref <18)
Troponin I (High Sensitivity): <2 ng/L (ref <18)

## 2019-05-19 LAB — D-DIMER, QUANTITATIVE: D-Dimer, Quant: 0.28 ug/mL-FEU (ref 0.00–0.50)

## 2019-05-19 LAB — SARS CORONAVIRUS 2 (TAT 6-24 HRS): SARS Coronavirus 2: NEGATIVE

## 2019-05-19 MED ORDER — HYDROXYZINE HCL 25 MG PO TABS: 25.0000 mg | ORAL_TABLET | Freq: Four times a day (QID) | ORAL | 0 refills | Status: AC

## 2019-05-19 MED ORDER — SODIUM CHLORIDE 0.9% FLUSH: 3.0000 mL | Freq: Once | INTRAVENOUS | Status: DC

## 2019-05-19 NOTE — ED Provider Notes (Signed)
[facility] EMERGENCY DEPARTMENT Provider Note   CSN: [id number] Arrival date & time: 05/19/19  1527     History Chief Complaint  Patient presents with  . Chest Pain    Cynthia Arnold is a 24 y.o. female.  HPI Patient comes today with complaints of chest tightness and shortness of breath that occurs intermittently and last for several hours with some sensation of heavy weight on her chest that began yesterday and has continued to today.  Patient states it is no true pain in her chest but more of a tight sensation in weight.  Denies any radiation of the sensation.  Denies any exertional component states that her shortness of breath is a sensation that she is not getting off oxygen when she is breathing and requires her just pause and take a large deep breath.  She denies any wheezing, history of asthma or COPD.  Denies any smoking history and denies any drug or alcohol use.  States that she drinks caffeine frequently but just on regular basis states that she drinks at least 2 cups of coffee a day as she works at [employer] and has it readily available.   Patient has any history of blood clots, no pleuritic component of chest pain, denies calf pain or swelling, recent trauma, immobilization, surgery no history of cancer, no hemoptysis.  Patient was seen at urgent care and sent to ED for concern for blood clot.  Patient denies any fevers, chills headache or dizziness abdominal pain nausea diaphoresis back pain or weakness.     Past Medical History:  Diagnosis Date  . Medical history non-contributory     Patient Active Problem List   Diagnosis Date Noted  . Urinary retention 09/13/2018    Past Surgical History:  Procedure Laterality Date  . NO PAST SURGERIES       OB History    Gravida  1   Para  1   Term  1   Preterm      AB      Living  1     SAB      TAB      Ectopic      Multiple  0   Live Births  1           Family History  Problem  Relation Age of Onset  . Healthy Mother   . Healthy Father     Social History   Tobacco Use  . Smoking status: Never Smoker  . Smokeless tobacco: Never Used  Substance Use Topics  . Alcohol use: Yes  . Drug use: Not Currently    Home Medications Prior to Admission medications   Medication Sig Start Date End Date Taking? Authorizing Provider  acetaminophen (TYLENOL) 325 MG tablet Take 2 tablets (650 mg total) by mouth every 4 (four) hours as needed (for pain scale < 4). Patient not taking: Reported on 09/10/2018 09/03/18   Allie Bossier, MD  flavoxATE (URISPAS) 100 MG tablet Take 1 tablet (100 mg total) by mouth 3 (three) times daily as needed for bladder spasms. 09/19/18   Hermina Staggers, MD  hydrOXYzine (ATARAX/VISTARIL) 25 MG tablet Take 1 tablet (25 mg total) by mouth every 6 (six) hours. 05/19/19   Gailen Shelter, PA  ibuprofen (ADVIL) 600 MG tablet Take 1 tablet (600 mg total) by mouth every 6 (six) hours. Patient not taking: Reported on 10/03/2018 09/03/18   Allie Bossier, MD  Prenatal Vit-Fe Fumarate-FA (PRENATAL MULTIVITAMIN) TABS  tablet Take 1 tablet by mouth daily at 12 noon.    [provider]    Allergies    Patient has no known allergies.  Review of Systems   Review of Systems  Constitutional: Negative for chills and fever.  HENT: Negative for congestion.   Eyes: Negative for pain.  Respiratory: Positive for chest tightness and shortness of breath. Negative for cough.   Cardiovascular: Positive for chest pain. Negative for leg swelling.  Gastrointestinal: Negative for abdominal pain and vomiting.  Genitourinary: Negative for dysuria.  Musculoskeletal: Negative for myalgias.  Skin: Negative for rash.  Neurological: Negative for dizziness and headaches.    Physical Exam Updated Vital Signs BP 124/84   Pulse (!) 105   Temp 98.4 F (36.9 C)   Resp 18   Ht 5' (1.524 m)   Wt 56.7 kg   LMP 05/12/2019   SpO2 98%   BMI 24.41 kg/m   Physical Exam Vitals  and nursing note reviewed.  Constitutional:      General: She is not in acute distress.    Comments: In no acute distress  HENT:     Head: Normocephalic and atraumatic.     Nose: Nose normal.     Mouth/Throat:     Mouth: Mucous membranes are moist.  Eyes:     General: No scleral icterus. Cardiovascular:     Rate and Rhythm: Regular rhythm. Tachycardia present.     Pulses: Normal pulses.     Heart sounds: Normal heart sounds. No murmur. No friction rub.     Comments: Heart rate is 104  Pulmonary:     Effort: Pulmonary effort is normal. No respiratory distress.     Breath sounds: No wheezing.     Comments: Lungs clear to auscultation bilaterally no wheezing.  No anterior chest wall tenderness to palpation. No increased work of breathing, speaking full sentences without difficulty Chest:     Chest wall: No tenderness.  Abdominal:     Palpations: Abdomen is soft.     Tenderness: There is no abdominal tenderness. There is no guarding or rebound.     Hernia: No hernia is present.  Musculoskeletal:     Cervical back: Normal range of motion.     Right lower leg: No edema.     Left lower leg: No edema.  Skin:    General: Skin is warm and dry.     Capillary Refill: Capillary refill takes less than 2 seconds.  Neurological:     Mental Status: She is alert and oriented to person, place, and time. Mental status is at baseline.  Psychiatric:        Mood and Affect: Mood normal.        Behavior: Behavior normal.     ED Results / Procedures / Treatments   Labs (all labs ordered are listed, but only abnormal results are displayed) Labs Reviewed  BASIC METABOLIC PANEL - Abnormal; Notable for the following components:      Result Value   CO2 21 (*)    All other components within normal limits  SARS CORONAVIRUS 2 (TAT 6-24 HRS)  CBC  D-DIMER, QUANTITATIVE (NOT AT [facility])  I-STAT BETA HCG BLOOD, ED ([facility], [facility], [facility] ONLY)  TROPONIN I (HIGH SENSITIVITY)  TROPONIN I (HIGH SENSITIVITY)     EKG None  ED ECG REPORT   Date: 05/19/2019  Rate: 104  Rhythm: normal sinus rhythm mild sinus arrhythmia   QRS Axis: normal  Intervals: normal  ST/T Wave  abnormalities: nonspecific T wave changes isolated T wave inversion in V1  Conduction Disutrbances:none  Narrative Interpretation:   Old EKG Reviewed: none available  I have personally reviewed the EKG tracing and agree with the computerized printout as noted.   Radiology DG Chest 2 View  Result Date: 05/19/2019 CLINICAL DATA:  Three days of shortness of breath, concern for PE EXAM: CHEST - 2 VIEW COMPARISON:  None. FINDINGS: No consolidation, features of edema, pneumothorax, or effusion. Pulmonary vascularity is normally distributed. The cardiomediastinal contours are unremarkable. No acute osseous or soft tissue abnormality. IMPRESSION: No acute cardiopulmonary abnormality. Electronically Signed   By: Kreg Shropshire M.D.   On: 05/19/2019 16:33    Procedures Procedures (including critical care time)  Medications Ordered in ED Medications  sodium chloride flush (NS) 0.9 % injection 3 mL (has no administration in time range)    ED Course  I have reviewed the triage vital signs and the nursing notes.  Pertinent labs & imaging results that were available during my care of the patient were reviewed by me and considered in my medical decision making (see chart for details).    MDM Rules/Calculators/A&P                      Patient is 24 year old female presented today with chest pain shortness of breath.Chest tightness and shortness of breath occurs intermittently and last for several hours with some sensation of heavy weight on her chest that began yesterday and has continued to today.   Chest pain is very atypical she has no risk factors for ACS.  Did obtain D-dimer to rule out PE as patient does have some mild tachycardia however dimer is within normal limits.  My review of EMR.  The patient has had elevated heart rate at  clinic visits 8 months ago and a year ago.  Very likely the patient has resting tachycardia.  Doubt arrhythmia or SVT.  She is having symptoms currently and has no irregular heart rate or severe tachycardia.  Troponin x2 within normal limits.  EKG is nonischemic.  No long QT, dagger Q waves precordially, PR interval abnormalities.  Doubt HOCM, Brugada, long QT, Wolff-Parkinson-White, LGL, doubt right ventricular arrhythmogenic dysplasia. Suspect some aspect of caffeine use or anxiety.  I discussed this concern with patient who is understanding that this is most likely explanation.  Will test patient for Covid.  No electrolyte abnormalities mildly decreased CO2 likely due to patient breathing deeply.  No leukocytosis or anemia.  Covid swab pending at time of discharge.  Patient is not pregnant.  Doubt cardiac cause of patient's symptoms she does not have EKG evidence of ACS, pericarditis, arrhythmia or ischemia.  Doubt COPD asthma she has no history of similar no wheezing.  Doubt pneumonia she is afebrile with no infiltrate on chest x-ray.  Doubt pneumothorax that she has bilateral lung sounds and no evidence of such CXR.  She is not anemic and I doubt neuromuscular cause of symptoms as it seems to come and go making GBS or ALS unlikely.  Has a history of myasthenia gravis.  No diplopia or vision changes.  The medical records were personally reviewed by myself. I personally reviewed all lab results and interpreted all imaging studies and either concurred with their official read or contacted radiology for clarification.   This patient appears reasonably screened and I doubt any other medical condition requiring further workup, evaluation, or treatment in the ED at this time prior to discharge.  Patient's vitals are WNL apart from vital sign abnormalities discussed above, patient is in NAD, and able to ambulate in the ED at their baseline and able to tolerate PO.  Pain has been managed or a plan has been  made for home management and has no complaints prior to discharge. Patient is comfortable with above plan and for discharge at this time. All questions were answered prior to disposition. Results from the ER workup discussed with the patient face to face and all questions answered to the best of my ability. The patient is safe for discharge with strict return precautions. Patient appears safe for discharge with appropriate follow-up. Conveyed my impression with the patient and they voiced understanding and are agreeable to plan.   An After Visit Summary was printed and given to the patient.  Portions of this note were generated with Scientist, clinical (histocompatibility and immunogenetics). Dictation errors may occur despite best attempts at proofreading.    Final Clinical Impression(s) / ED Diagnoses Final diagnoses:  Chest pain, unspecified type  SOB (shortness of breath)    Rx / DC Orders ED Discharge Orders         Ordered    hydrOXYzine (ATARAX/VISTARIL) 25 MG tablet  Every 6 hours     05/19/19 1855           Gailen Shelter, [place] 05/19/19 1912    Eber Hong, MD 05/20/19 2229

## 2019-05-19 NOTE — ED Triage Notes (Signed)
[facility]  She was sent here to r/o a pe  Some  Some chest pain after she drinks coffee  And some sll sob for 3 days none  At present.  lmp this past tuesday

## 2019-05-19 NOTE — ED Notes (Signed)
Pt ambulated to the bathroom with steady gait.

## 2019-05-19 NOTE — Discharge Instructions (Signed)
I have prescribed you hydroxyzine which she may fill if you would like.  This medication can help with anxiety.  You may take it up to 3 times a day as needed.  It may make you slightly drowsy.  May cause some mouth dryness.  I recommend he follow-up with cardiology to eventually wear Holter monitor or other cardiac monitoring device if they deem necessary.

## 2019-06-26 IMAGING — US US MFM OB COMP +14 WKS
1 series · 14 of 28 positions shown · non-contrast
Comparison: none

[Series 1: us mfm ob comp +14 wks · 120 acquisitions, 14 frames shown]
[im 5/120]
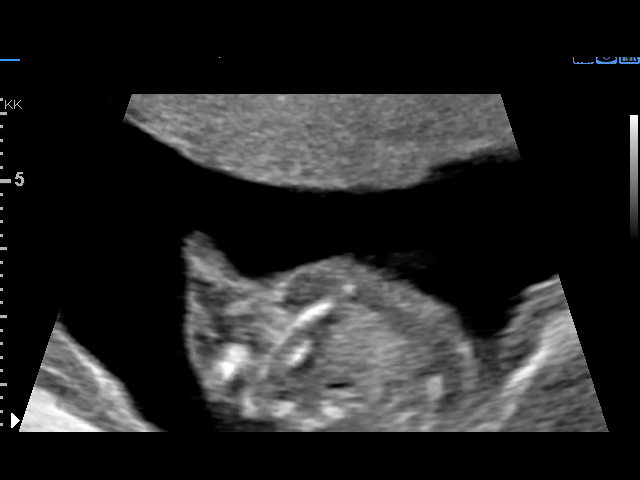
[im 14/120]
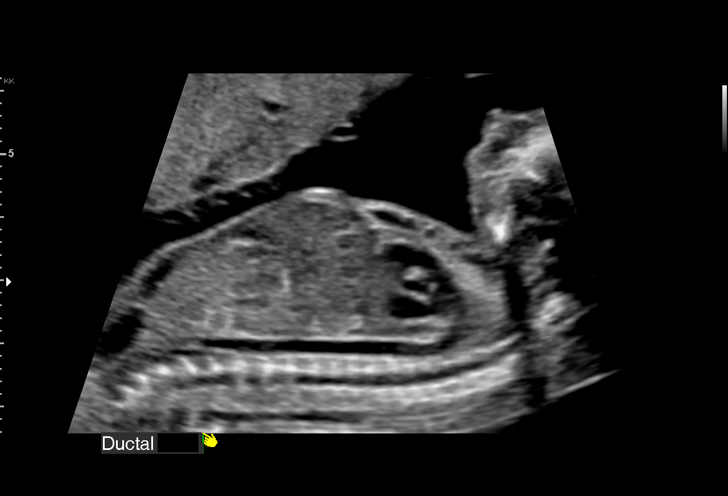
[im 23/120]
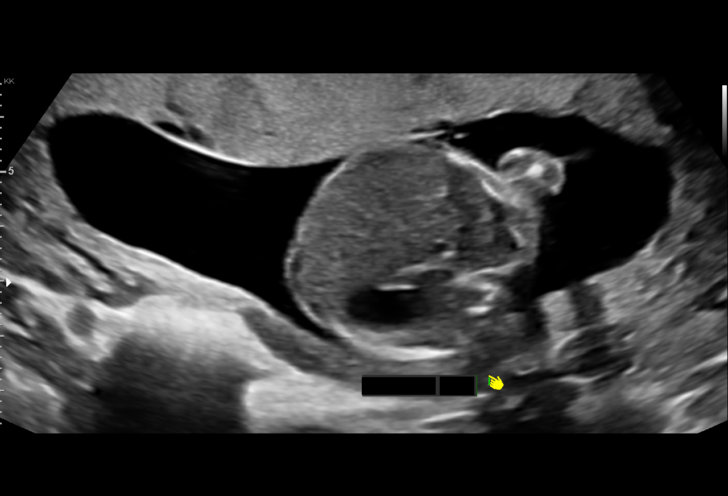
[im 31/120]
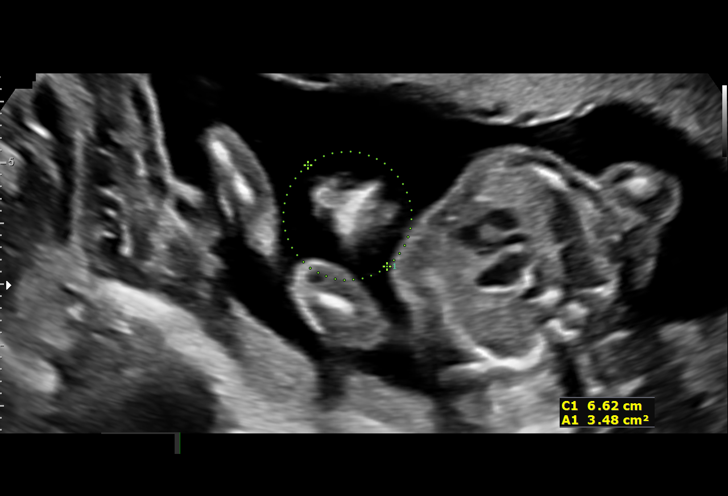
[im 40/120]
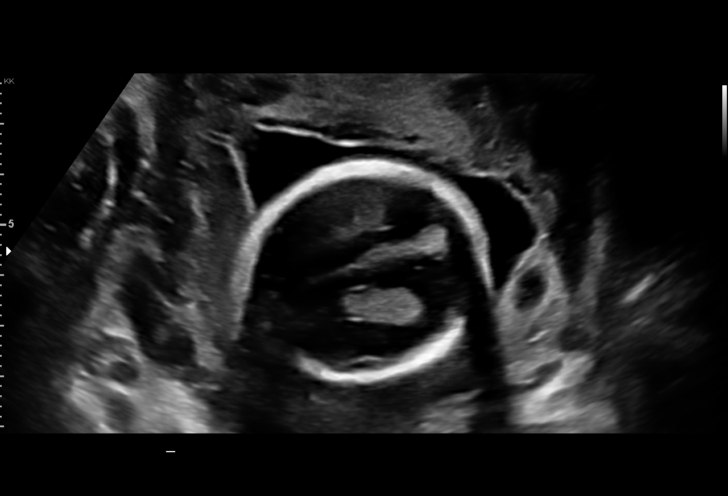
[im 49/120]
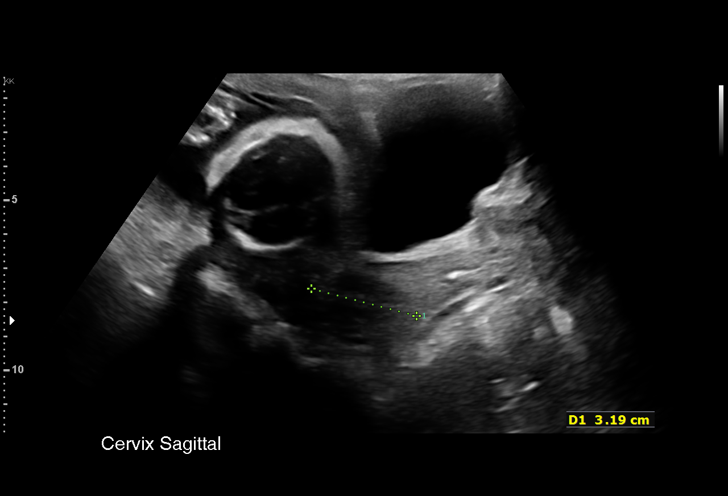
[im 58/120]
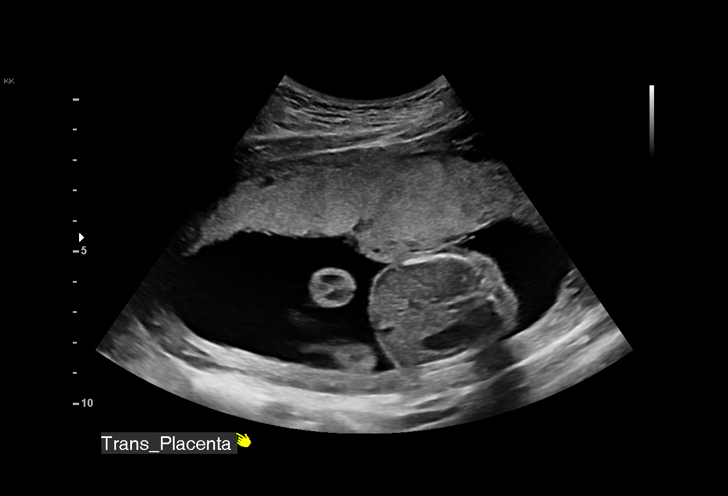
[im 67/120]
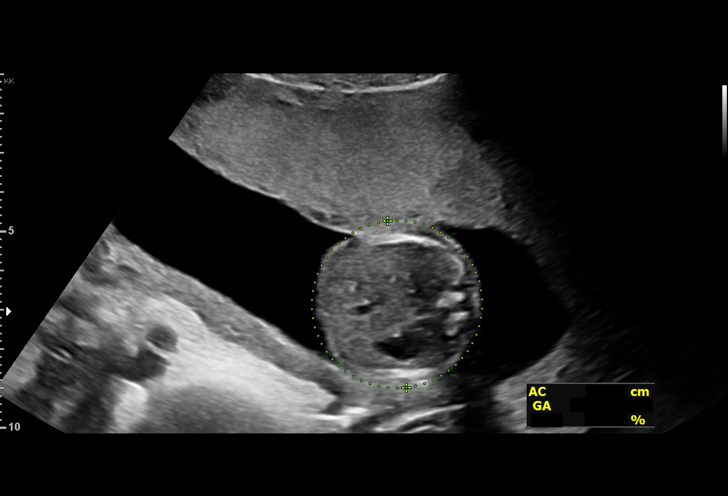
[im 75/120]
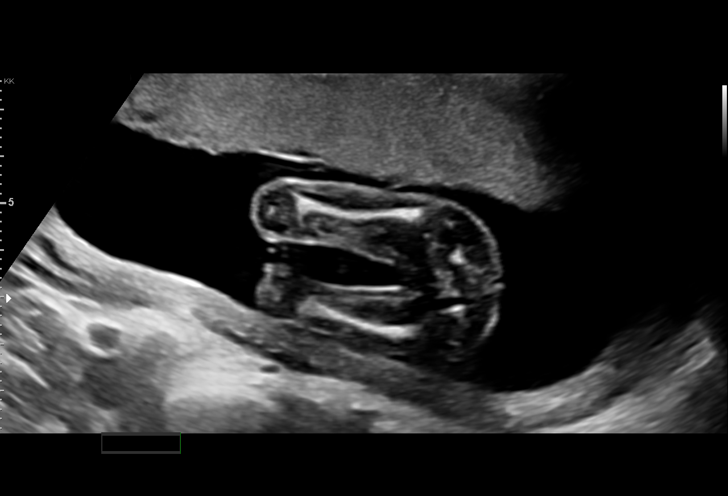
[im 84/120]
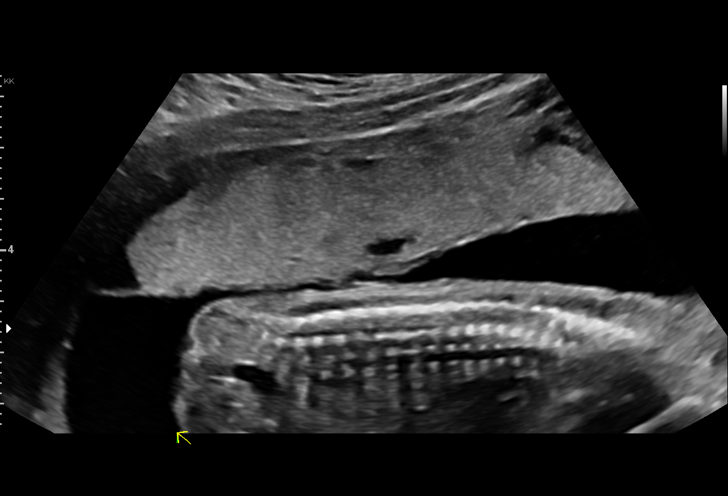
[im 93/120]
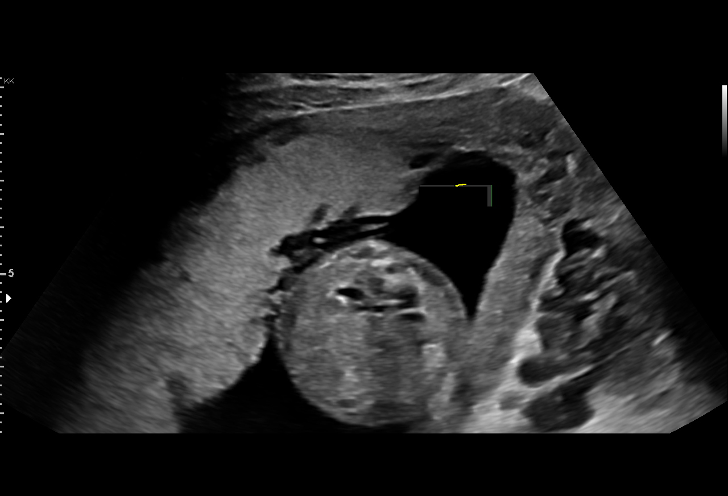
[im 102/120]
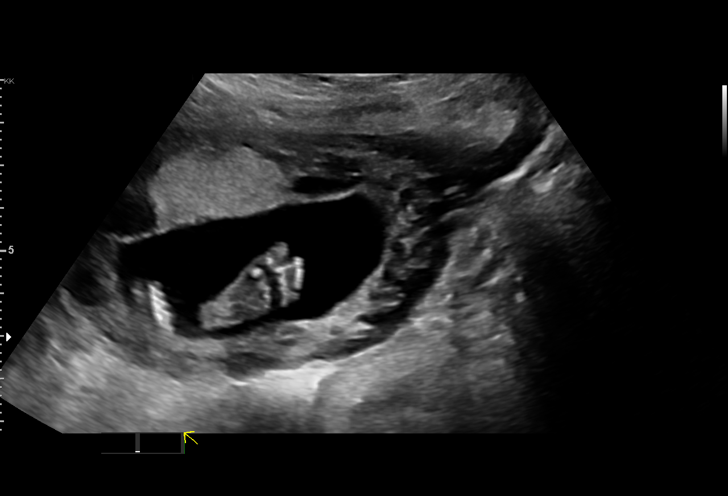
[im 111/120]
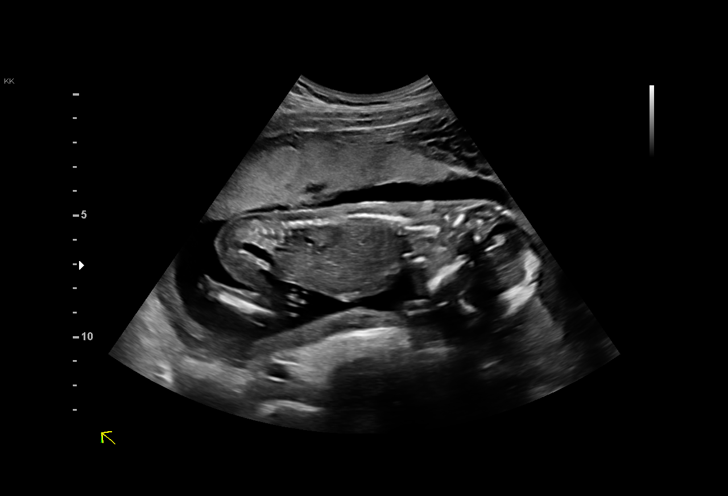
[im 120/120]
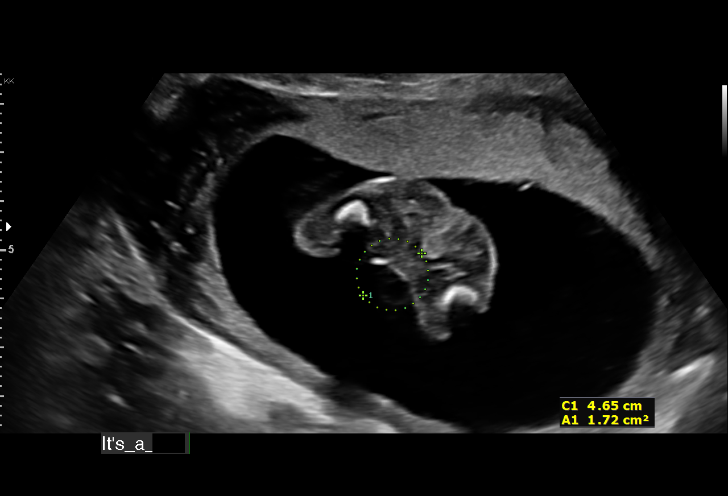

[14 of 28 positions shown; findings below may reference images not displayed]

LARSEN CNM

  1  US MFM OB COMP + 14 WK               76805.01     GIANG PINILLA
 ----------------------------------------------------------------------

 ----------------------------------------------------------------------
Indications

  Encounter for antenatal screening for
  malformations
  19 weeks gestation of pregnancy
 ----------------------------------------------------------------------
Vital Signs

 BMI:
Fetal Evaluation

 Num Of Fetuses:          1
 Fetal Heart Rate(bpm):   148
 Cardiac Activity:        Observed
 Presentation:            Cephalic
 Placenta:                Anterior
 P. Cord Insertion:       Visualized

 Amniotic Fluid
 AFI FV:      Within normal limits

                             Largest Pocket(cm)

Biometry

 BPD:      44.6  mm     G. Age:  19w 3d         46  %    CI:        77.91   %    70 - 86
                                                         FL/HC:       17.9  %    16.8 -
 HC:      159.9  mm     G. Age:  18w 6d         13  %    HC/AC:       1.19       1.09 -
 AC:      134.9  mm     G. Age:  19w 0d         25  %    FL/BPD:      64.3  %
 FL:       28.7  mm     G. Age:  18w 6d         19  %    FL/AC:       21.3  %    20 - 24
 NFT:       4.6  mm

 Est. FW:     263   gm     0 lb 9 oz     34  %
OB History

 Gravidity:    1         Term:   0        Prem:   0        SAB:   0
 TOP:          0       Ectopic:  0        Living: 0
Gestational Age

 LMP:           19w 4d        Date:  11/17/17                 EDD:   08/24/18
 U/S Today:     19w 0d                                        EDD:   08/28/18
 Best:          19w 4d     Det. By:  LMP  (11/17/17)          EDD:   08/24/18
Anatomy

 Cranium:               Appears normal         Aortic Arch:            Appears normal
 Cavum:                 Appears normal         Ductal Arch:            Appears normal
 Ventricles:            Appears normal         Diaphragm:              Appears normal
 Choroid Plexus:        Appears normal         Stomach:                Appears normal, left
                                                                       sided
 Cerebellum:            Appears normal         Abdomen:                Appears normal
 Posterior Fossa:       Appears normal         Abdominal Wall:         Appears nml (cord
                                                                       insert, abd wall)
 Nuchal Fold:           Appears normal         Cord Vessels:           Appears normal (3
                                                                       vessel cord)
 Face:                  Appears normal         Kidneys:                Appear normal
                        (orbits and profile)
 Lips:                  Appears normal         Bladder:                Appears normal
 Thoracic:              Appears normal         Spine:                  Appears normal
 Heart:                 Appears normal         Upper Extremities:      Appears normal
                        (4CH, axis, and situs
 RVOT:                  Appears normal         Lower Extremities:      Appears normal
 LVOT:                  Appears normal

 Other:  Female gender
Cervix Uterus Adnexa

 Cervix
 Length:            3.5  cm.
 Normal appearance by transabdominal scan.
Impression

 Normal interval growth.  No ultrasonic evidence of structural
 fetal anomalies.
Recommendations

 Follow up as clinically indicated.

## 2019-11-06 IMAGING — US US MFM OB FOLLOW UP
1 series · 13 of 28 positions shown · non-contrast
Comparison: none

[Series 1: us mfm ob follow up · 29 acquisitions, 13 frames shown]
[im 2/29]
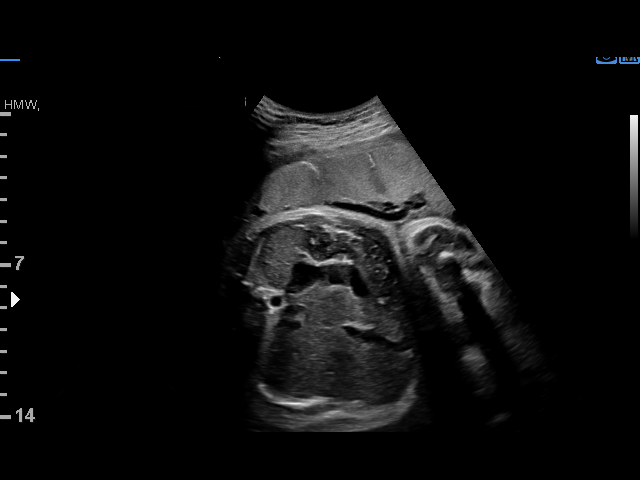
[im 4/29]
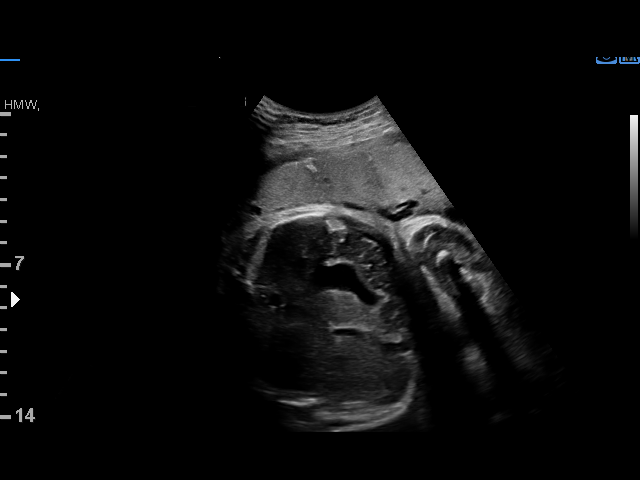
[im 6/29]
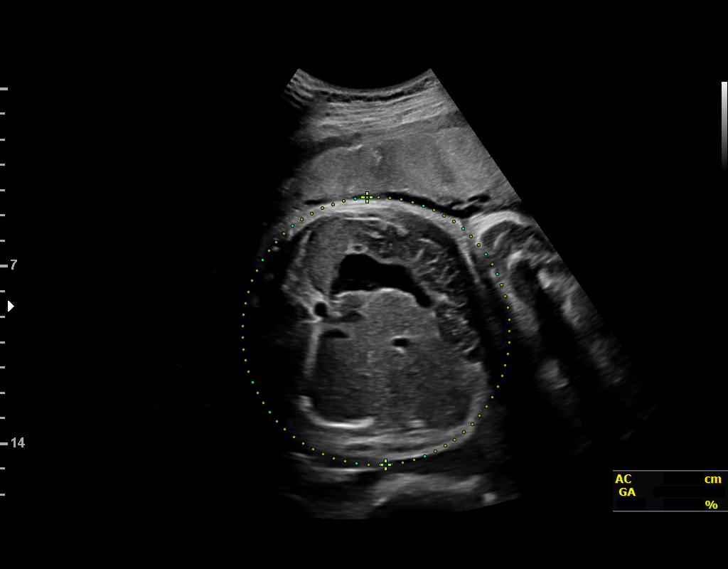
[im 8/29]
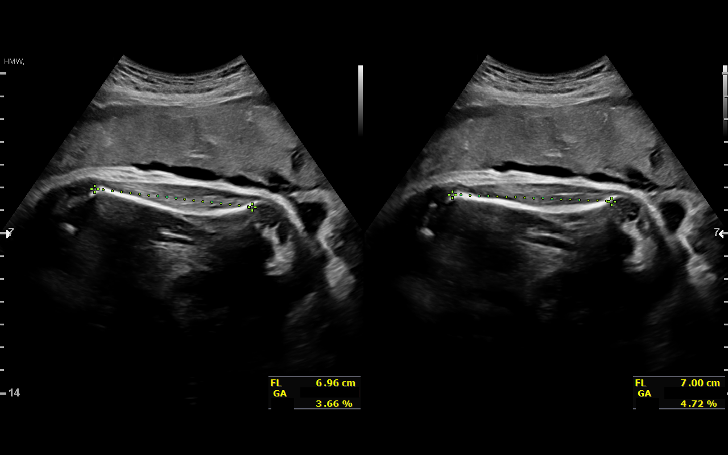
[im 10/29]
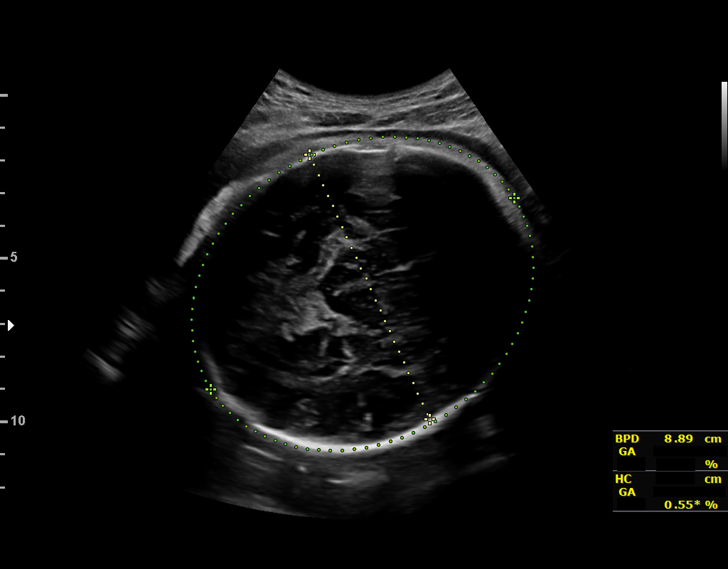
[im 12/29]
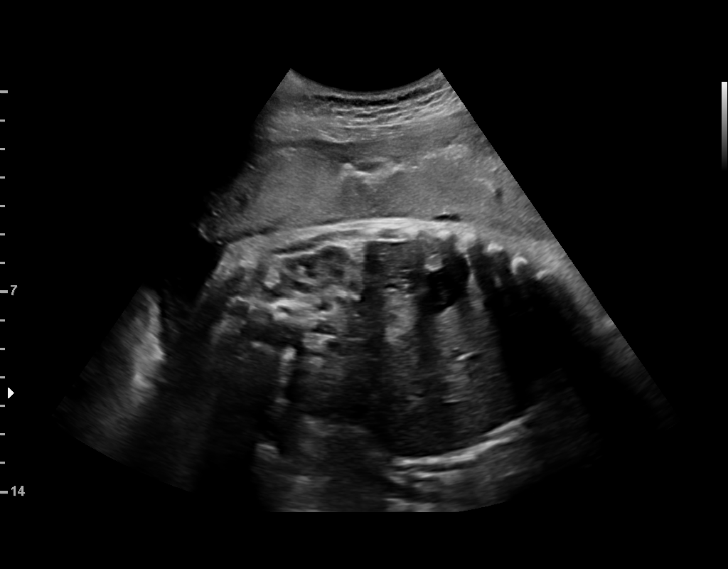
[im 15/29]
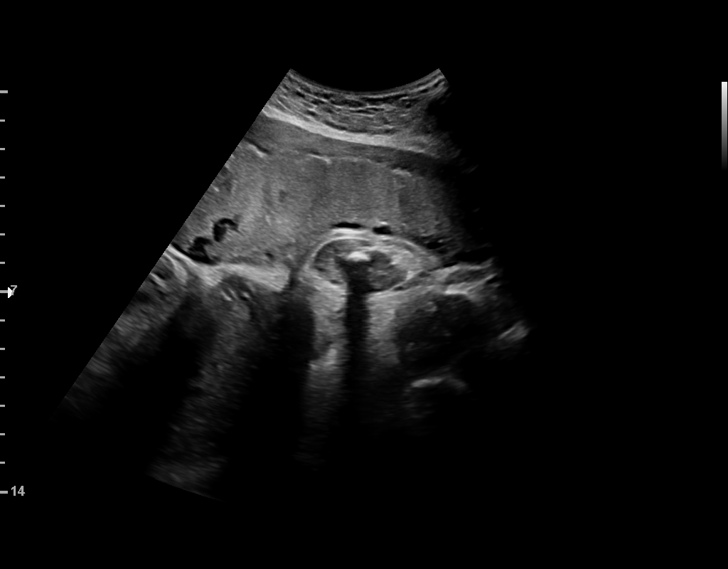
[im 17/29]
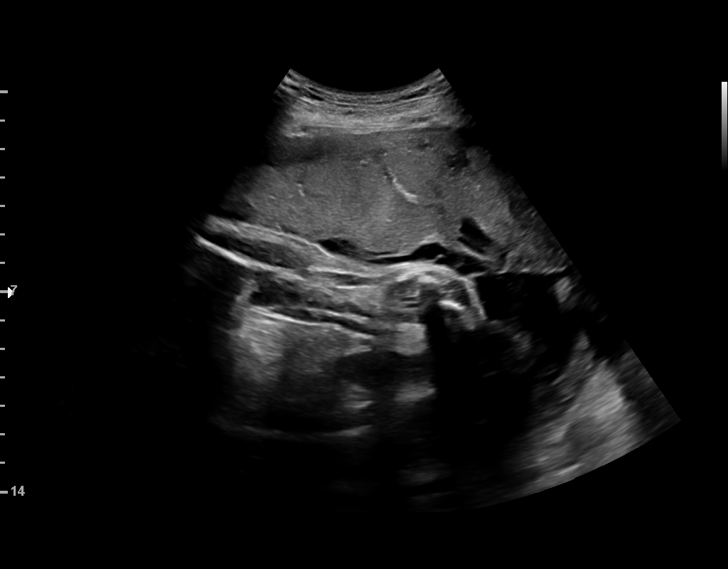
[im 19/29]
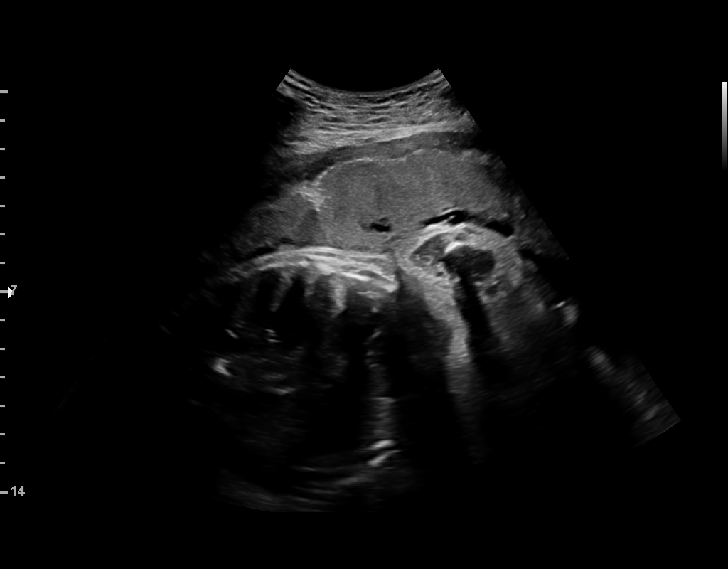
[im 21/29]
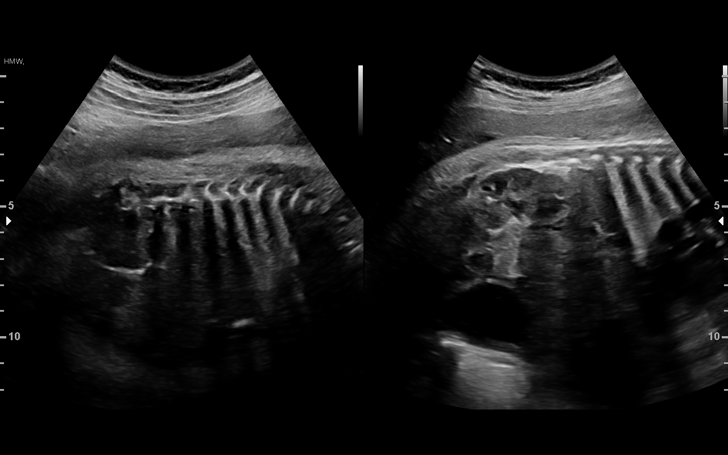
[im 23/29]
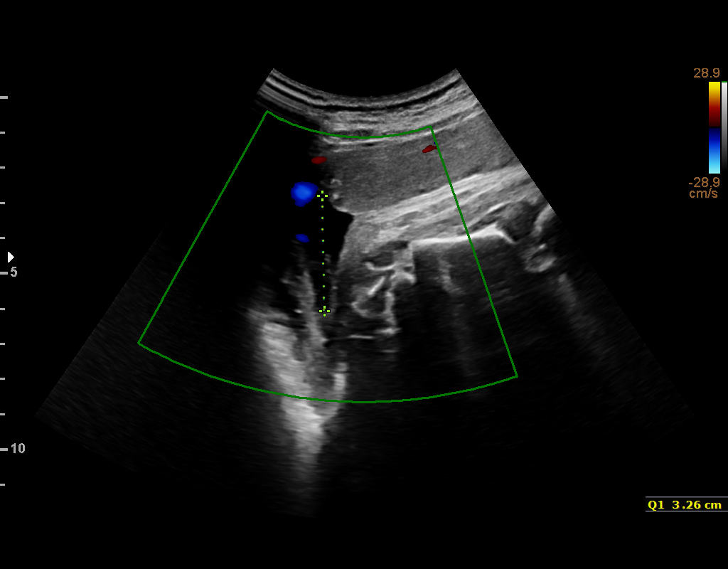
[im 25/29]
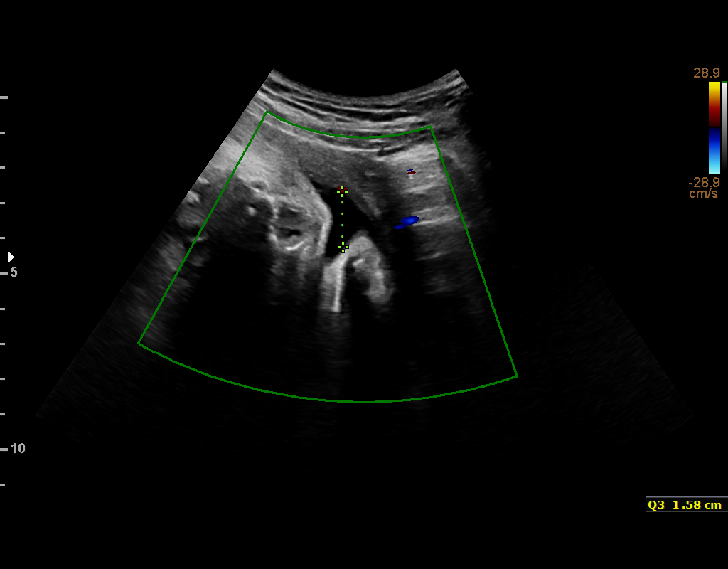
[im 27/29]
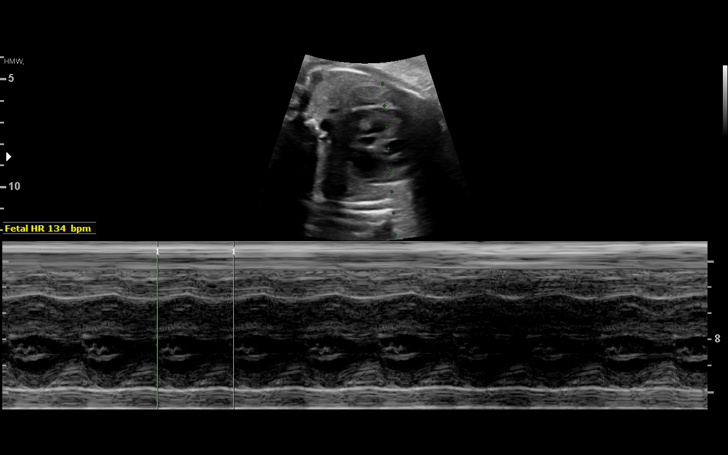

[13 of 28 positions shown; findings below may reference images not displayed]

LAFERISHVILI CNM

 ----------------------------------------------------------------------

 ----------------------------------------------------------------------
Indications

  38 weeks gestation of pregnancy
  Uterine size-date discrepancy, third trimester
 ----------------------------------------------------------------------
Vital Signs

                                                Height:        5'1"
Fetal Evaluation

 Num Of Fetuses:          1
 Fetal Heart Rate(bpm):   134
 Cardiac Activity:        Observed
 Presentation:            Cephalic
 Placenta:                Anterior
 P. Cord Insertion:       Previously Visualized

 Amniotic Fluid
 AFI FV:      Within normal limits

 AFI Sum(cm)     %Tile       Largest Pocket(cm)
 10.53           31

 RUQ(cm)       RLQ(cm)       LUQ(cm)        LLQ(cm)

Biometry

 BPD:      88.4  mm     G. Age:  35w 5d          9  %    CI:          77.7  %    70 - 86
                                                         FL/HC:       22.0  %    20.6 -
 HC:      317.4  mm     G. Age:  35w 5d        < 3  %    HC/AC:       0.95       0.87 -
 AC:      334.1  mm     G. Age:  37w 2d         35  %    FL/BPD:      79.0  %    71 - 87
 FL:       69.8  mm     G. Age:  35w 6d          5  %    FL/AC:       20.9  %    20 - 24
 Est. FW:    2433   gm     6 lb 9 oz     37  %
OB History

 Gravidity:    1         Term:   0        Prem:   0        SAB:   0
 TOP:          0       Ectopic:  0        Living: 0
Gestational Age

 LMP:           38w 4d        Date:  11/17/17                 EDD:   08/24/18
 U/S Today:     36w 1d                                        EDD:   09/10/18
 Best:          38w 4d     Det. By:  LMP  (11/17/17)          EDD:   08/24/18
Anatomy

 Cranium:               Appears normal         Aortic Arch:            Previously seen
 Cavum:                 Previously seen        Ductal Arch:            Previously seen
 Ventricles:            Previously seen        Diaphragm:              Previously seen
 Choroid Plexus:        Previously seen        Stomach:                Appears normal, left
                                                                       sided
 Cerebellum:            Previously seen        Abdomen:                Previously seen
 Posterior Fossa:       Previously seen        Abdominal Wall:         Previously seen
 Nuchal Fold:           Previously seen        Cord Vessels:           Previously seen
 Face:                  Orbits and profile     Kidneys:                Appear normal
                        previously seen
 Lips:                  Previously seen        Bladder:                Appears normal
 Thoracic:              Appears normal         Spine:                  Previously seen
 Heart:                 Appears normal         Upper Extremities:      Previously seen
                        (4CH, axis, and situs
 RVOT:                  Appears normal         Lower Extremities:      Previously seen
 LVOT:                  Appears normal

 Other:  Female gender previously seen.
Cervix Uterus Adnexa

 Cervix
 Not visualized (advanced GA >09wks)
Impression

 Fetal growth is appropriate for gestational age. Amniotic fluid
 is normal and good fetal activity is seen.
Recommendations

 Follow-up as clinically indicated.
                 [place], [place]

## 2020-08-10 IMAGING — CR DG CHEST 2V
2 series · 2 of 2 positions shown · non-contrast
Comparison: None.

CLINICAL DATA: Three days of shortness of breath, concern for PE

EXAM:
CHEST - 2 VIEW

[chest pa]
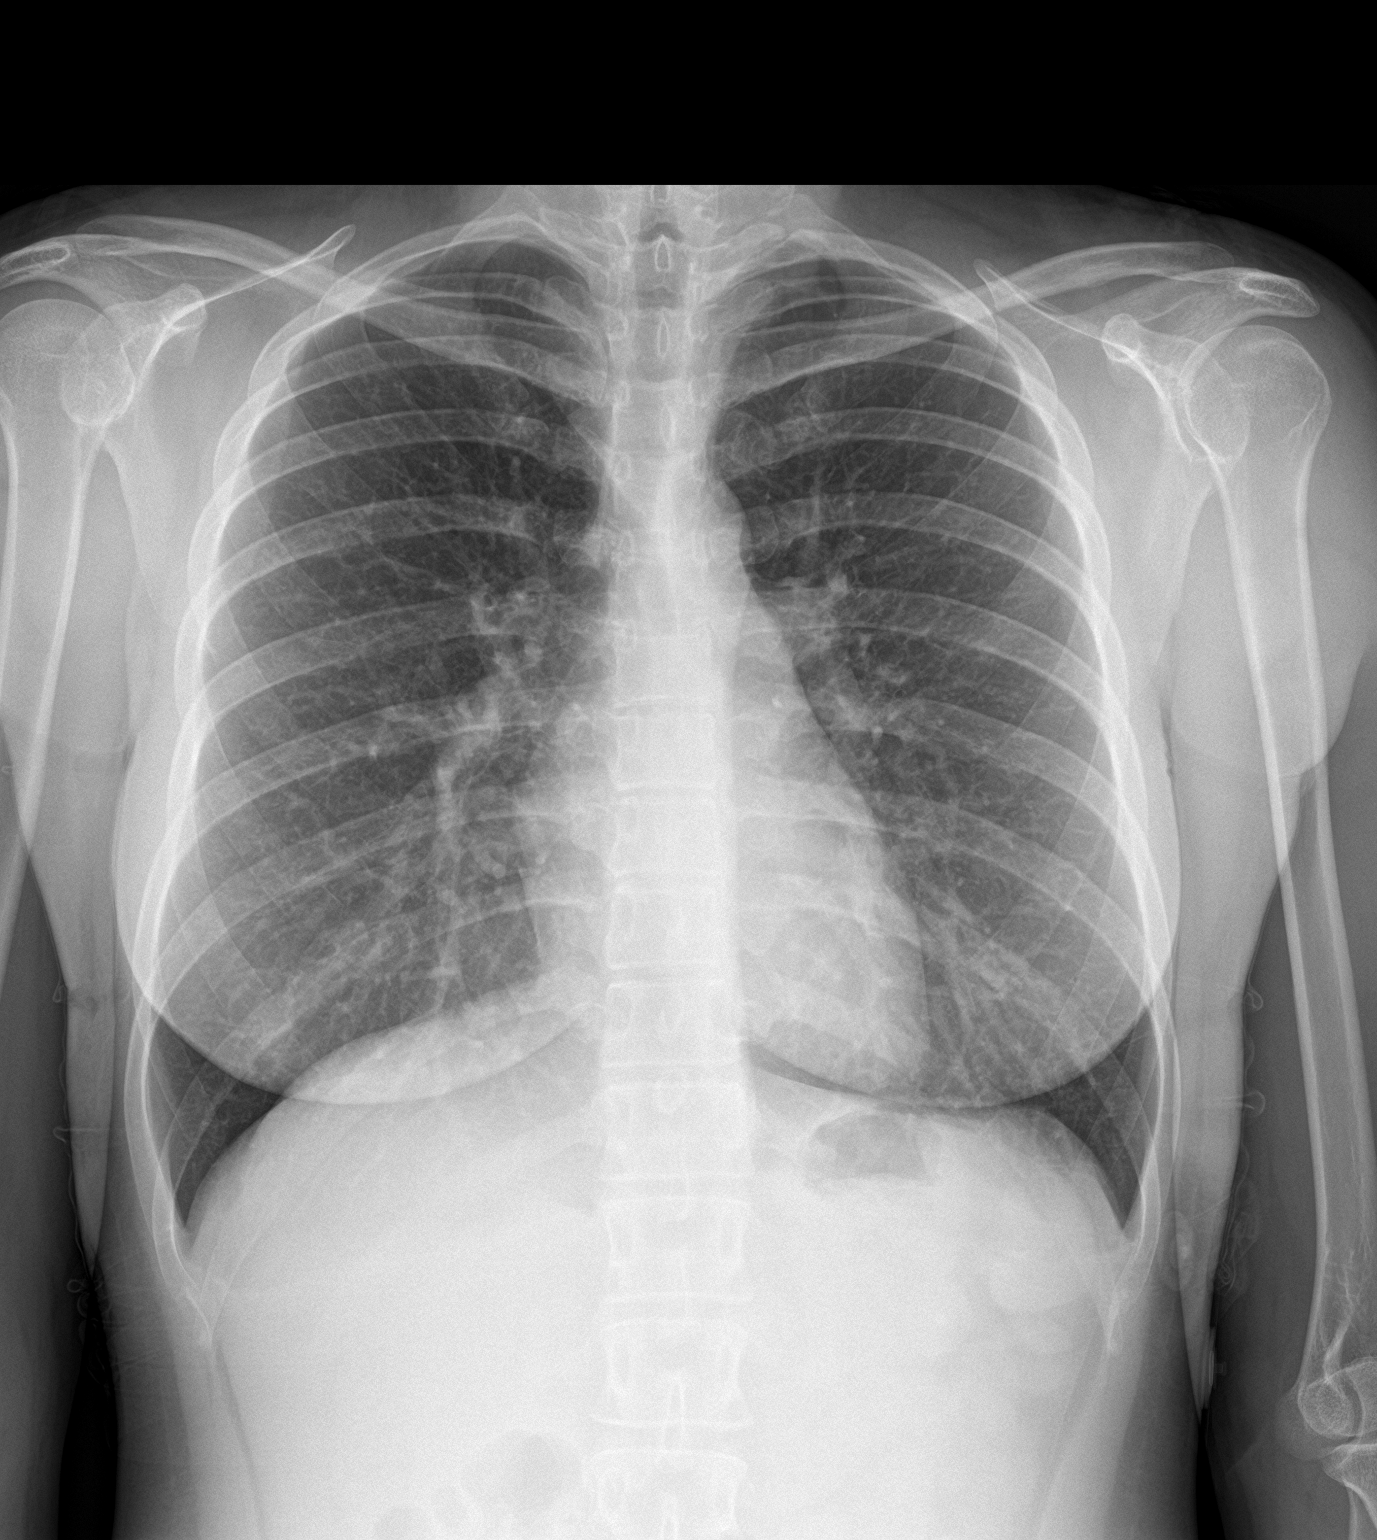

[chest lat]
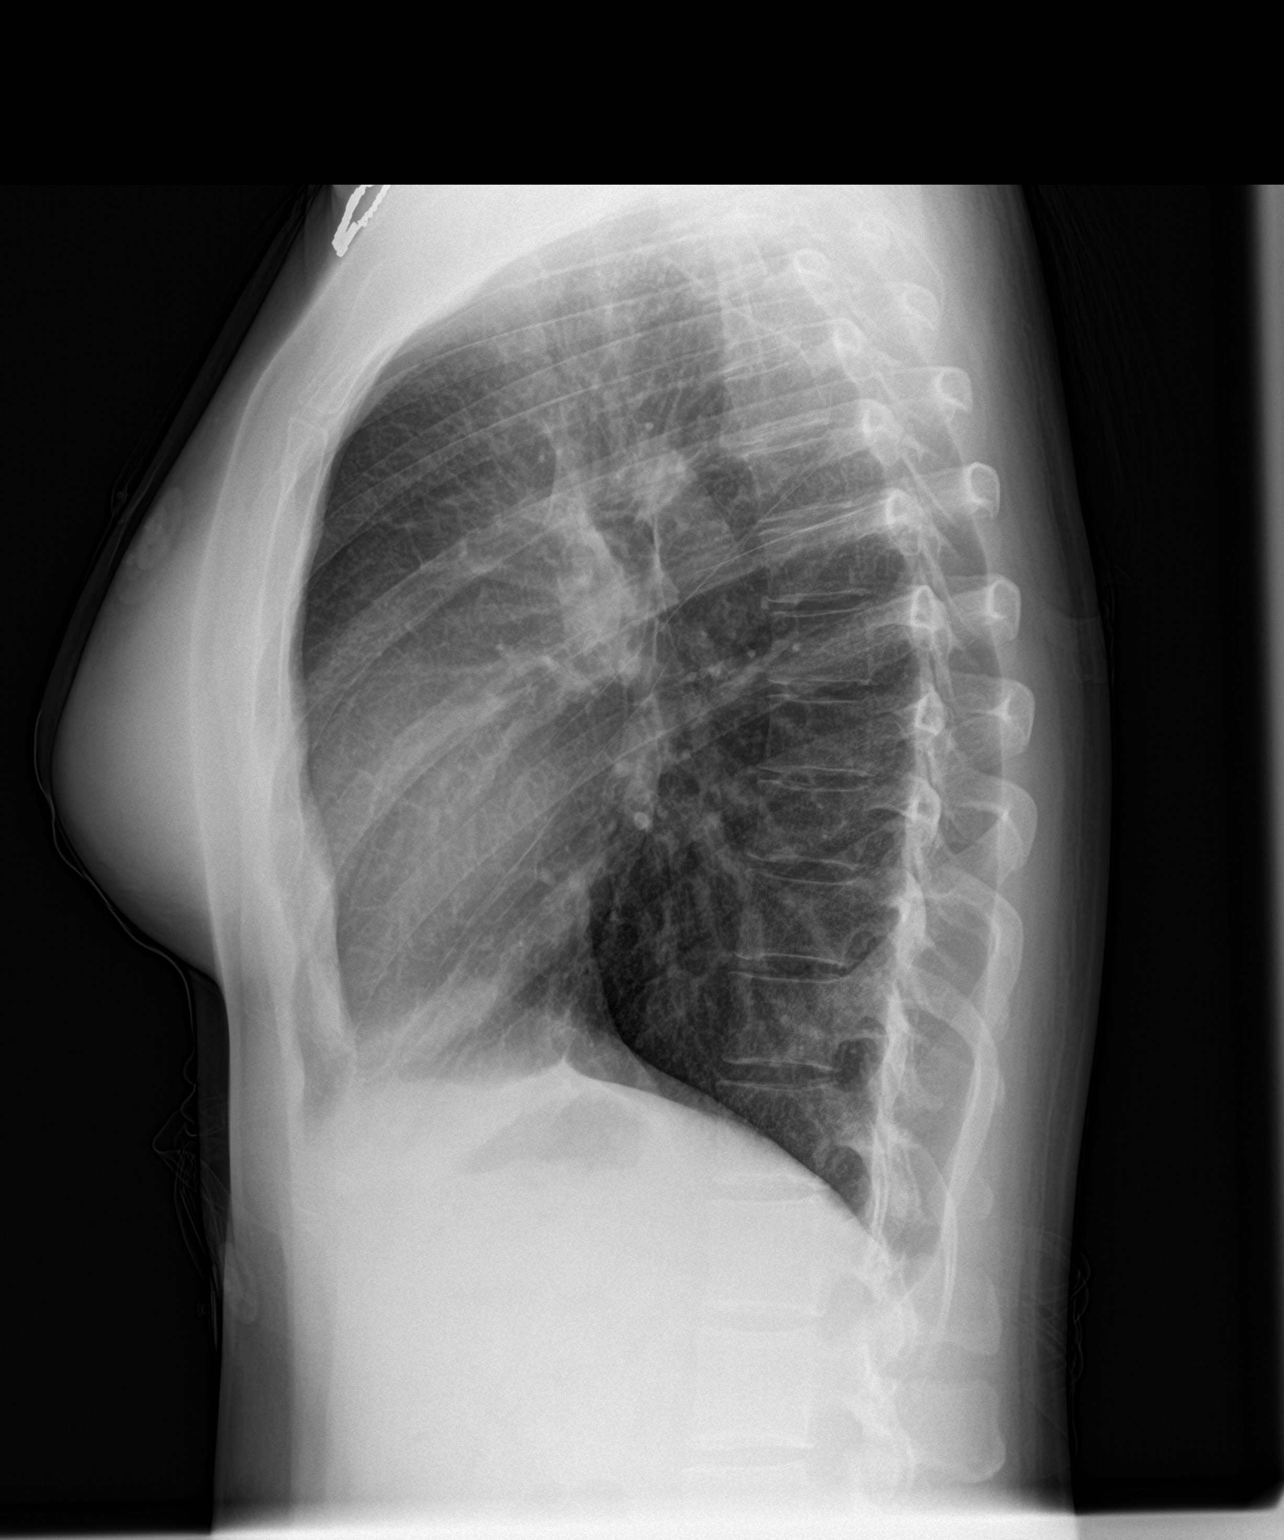

[2 of 2 positions shown; findings below may reference images not displayed]

FINDINGS: No consolidation, features of edema, pneumothorax, or effusion.
Pulmonary vascularity is normally distributed. The cardiomediastinal
contours are unremarkable. No acute osseous or soft tissue
abnormality.
IMPRESSION: No acute cardiopulmonary abnormality.

## 2020-11-02 DIAGNOSIS — T7840XA Allergy, unspecified, initial encounter: Secondary | ICD-10-CM | POA: Diagnosis not present

## 2022-11-10 ENCOUNTER — Inpatient Hospital Stay (HOSPITAL_COMMUNITY): Payer: 59

## 2022-11-10 ENCOUNTER — Inpatient Hospital Stay (HOSPITAL_COMMUNITY)
Admission: AD | Admit: 2022-11-10 | Discharge: 2022-11-10 | Disposition: A | Discharge status: Home / Self Care | Payer: 59 | Attending: Obstetrics & Gynecology | Admitting: Obstetrics & Gynecology

## 2022-11-10 ENCOUNTER — Encounter (HOSPITAL_COMMUNITY): Payer: Self-pay | Admitting: *Deleted

## 2022-11-10 DIAGNOSIS — O209 Hemorrhage in early pregnancy, unspecified: Secondary | ICD-10-CM | POA: Diagnosis not present

## 2022-11-10 DIAGNOSIS — Z3201 Encounter for pregnancy test, result positive: Secondary | ICD-10-CM | POA: Diagnosis present

## 2022-11-10 DIAGNOSIS — Z3A01 Less than 8 weeks gestation of pregnancy: Secondary | ICD-10-CM | POA: Diagnosis not present

## 2022-11-10 DIAGNOSIS — Z3A00 Weeks of gestation of pregnancy not specified: Secondary | ICD-10-CM | POA: Diagnosis not present

## 2022-11-10 DIAGNOSIS — O3680X0 Pregnancy with inconclusive fetal viability, not applicable or unspecified: Secondary | ICD-10-CM

## 2022-11-10 DIAGNOSIS — Z6790 Unspecified blood type, Rh positive: Secondary | ICD-10-CM | POA: Diagnosis not present

## 2022-11-10 HISTORY — DX: Depression, unspecified: F32.A

## 2022-11-10 LAB — URINALYSIS, ROUTINE W REFLEX MICROSCOPIC
Bacteria, UA: NONE SEEN
Bilirubin Urine: NEGATIVE
Glucose, UA: NEGATIVE mg/dL
Ketones, ur: 20 mg/dL — AB
Nitrite: NEGATIVE
Protein, ur: NEGATIVE mg/dL
RBC / HPF: >50 RBC/hpf (ref 0–5)
Specific Gravity, Urine: 1.006 (ref 1.005–1.030)
pH: 6 (ref 5.0–8.0)

## 2022-11-10 LAB — CBC
HCT: 38.2 % (ref 36.0–46.0)
Hemoglobin: 13.3 g/dL (ref 12.0–15.0)
MCH: 31.2 pg (ref 26.0–34.0)
MCHC: 34.8 g/dL (ref 30.0–36.0)
MCV: 89.7 fL (ref 80.0–100.0)
Platelets: 380 10*3/uL (ref 150–400)
RBC: 4.26 MIL/uL (ref 3.87–5.11)
RDW: 11.9 % (ref 11.5–15.5)
WBC: 9.6 10*3/uL (ref 4.0–10.5)
nRBC: 0 % (ref 0.0–0.2)

## 2022-11-10 LAB — POCT PREGNANCY, URINE: Preg Test, Ur: POSITIVE — AB

## 2022-11-10 LAB — HCG, QUANTITATIVE, PREGNANCY: hCG, Beta Chain, Quant, S: 1988 m[IU]/mL — ABNORMAL HIGH (ref <5)

## 2022-11-10 NOTE — MAU Note (Signed)
Cynthia Arnold is a 27 y.o. at Unknown here in MAU reporting: brown spotting on Monday, had some bleeding,  scant amt of dark red blood.  Today passed like a nickel sized clot when she wiped, seeing blood on pad, but not soaking pads. LMP: 5/17 Onset of complaint: Monday Pain score: none Vitals:   11/10/22 1529  BP: 116/72  Pulse: 97  Resp: 16  Temp: 98.5 F (36.9 C)  SpO2: 100%      Lab orders placed from triage:  +HPT  Scheduled first appt is on Monday

## 2022-11-10 NOTE — MAU Provider Note (Signed)
History     CSN: [id number]  Arrival date and time: 11/10/22 1429   None     Chief Complaint  Patient presents with   Vaginal Bleeding   Possible Pregnancy   HPI Cynthia Arnold is a 27 y.o. G2P1001 at unknown gestation who presents to MAU for vaginal bleeding and possible pregnancy. She reports brownish/red spotting started on Monday. Bleeding was light on Tuesday, but on Wednesday she noticed a big gush of blood and today bleeding is heavier. She reports she did pass a small nickel-sized clot. She is wearing a pad and has only changed twice today. She denies pain, fever, chills, or urinary s/s.    OB History     Gravida  2   Para  1   Term  1   Preterm      AB      Living  1      SAB      IAB      Ectopic      Multiple  0   Live Births  1           Past Medical History:  Diagnosis Date   Depression    PP    Past Surgical History:  Procedure Laterality Date   NO PAST SURGERIES      Family History  Problem Relation Age of Onset   Healthy Mother    Healthy Father     Social History   Tobacco Use   Smoking status: Never   Smokeless tobacco: Never  Vaping Use   Vaping status: Never Used  Substance Use Topics   Alcohol use: Not Currently   Drug use: Never    Allergies: No Known Allergies  No medications prior to admission.   Review of Systems  Genitourinary:  Positive for vaginal bleeding.  All other systems reviewed and are negative.  Physical Exam   Blood pressure 118/65, pulse 89, temperature 98.5 F (36.9 C), temperature source Oral, resp. rate 16, height 5' (1.524 m), weight 63.7 kg, last menstrual period 09/16/2022, SpO2 100%.  Physical Exam Vitals and nursing note reviewed.  Constitutional:      General: She is not in acute distress. Eyes:     Extraocular Movements: Extraocular movements intact.     Pupils: Pupils are equal, round, and reactive to light.  Cardiovascular:     Rate and Rhythm: Normal rate.  Pulmonary:      Effort: Pulmonary effort is normal. No respiratory distress.  Abdominal:     Palpations: Abdomen is soft.     Tenderness: There is no abdominal tenderness.  Musculoskeletal:        General: Normal range of motion.     Cervical back: Normal range of motion.  Skin:    General: Skin is warm and dry.  Neurological:     General: No focal deficit present.     Mental Status: She is alert and oriented to person, place, and time.  Psychiatric:        Mood and Affect: Mood normal.        Behavior: Behavior normal.    Results for orders placed or performed during the hospital encounter of 11/10/22 (from the past 24 hour(s))  Pregnancy, urine POC     Status: Abnormal   Collection Time: 11/10/22  2:48 PM  Result Value Ref Range   Preg Test, Ur POSITIVE (A) NEGATIVE  Urinalysis, Routine w reflex microscopic -Urine, Clean Catch     Status: Abnormal  Collection Time: 11/10/22  2:50 PM  Result Value Ref Range   Color, Urine YELLOW YELLOW   APPearance HAZY (A) CLEAR   Specific Gravity, Urine 1.006 1.005 - 1.030   pH 6.0 5.0 - 8.0   Glucose, UA NEGATIVE NEGATIVE mg/dL   Hgb urine dipstick LARGE (A) NEGATIVE   Bilirubin Urine NEGATIVE NEGATIVE   Ketones, ur 20 (A) NEGATIVE mg/dL   Protein, ur NEGATIVE NEGATIVE mg/dL   Nitrite NEGATIVE NEGATIVE   Leukocytes,Ua TRACE (A) NEGATIVE   RBC / HPF >50 0 - 5 RBC/hpf   WBC, UA 6-10 0 - 5 WBC/hpf   Bacteria, UA NONE SEEN NONE SEEN   Squamous Epithelial / HPF 0-5 0 - 5 /HPF  CBC     Status: None   Collection Time: 11/10/22  3:57 PM  Result Value Ref Range   WBC 9.6 4.0 - 10.5 K/uL   RBC 4.26 3.87 - 5.11 MIL/uL   Hemoglobin 13.3 12.0 - 15.0 g/dL   HCT 69.6 29.5 - 28.4 %   MCV 89.7 80.0 - 100.0 fL   MCH 31.2 26.0 - 34.0 pg   MCHC 34.8 30.0 - 36.0 g/dL   RDW 13.2 44.0 - 10.2 %   Platelets 380 150 - 400 K/uL   nRBC 0.0 0.0 - 0.2 %  hCG, quantitative, pregnancy     Status: Abnormal   Collection Time: 11/10/22  3:57 PM  Result Value Ref Range    hCG, Beta Chain, Quant, S 1,988 (H) <5 mIU/mL   US OB LESS THAN 14 WEEKS WITH OB TRANSVAGINAL  Result Date: 11/10/2022 CLINICAL DATA:  Vaginal bleeding EXAM: OBSTETRIC <14 WK [place] AND TRANSVAGINAL OB US TECHNIQUE: Both transabdominal and transvaginal ultrasound examinations were performed for complete evaluation of the gestation as well as the maternal uterus, adnexal regions, and pelvic cul-de-sac. Transvaginal technique was performed to assess early pregnancy. COMPARISON:  None Available. FINDINGS: Intrauterine gestational sac: None Yolk sac:  Visualized. Embryo:  Not Visualized. Maternal uterus/adnexae: Uterus is unremarkable. Endometrium measures 5 mm in thickness. No intrauterine gestational sac identified. Left ovary measures 1.6 x 3.2 x 1.1 cm. The right ovary measures 2.2 x 2.8 x 2.2 cm. No ectopic gestation identified. No free fluid in the cul-de-sac. IMPRESSION: 1. No intrauterine gestational sac identified consistent with pregnancy of unknown location. Follow-up with serial beta HCG and sonogram as clinically warranted. 2. Ovaries are unremarkable. Electronically Signed   By: Larose Hires D.O.   On: 11/10/2022 17:45    MAU Course  Procedures  MDM UA CBC, HCG [place]  CBC stable. Blood type B positive, Rhogam not indicated. HCG 1900. No evidence of IUP or ectopic on ultrasound.   Assessment and Plan   1. Pregnancy of unknown anatomic location   2. Vaginal bleeding affecting early pregnancy   3. Blood type, Rh positive    - Discharge home in stable condition - F/u in 48 hours for repeat bHCG. Patient will return to MAU on Saturday 7/13 - Pelvic rest and bleeding precautions reviewed. Return to MAU sooner as needed for new/worsening symptoms  Brand Males, CNM 11/10/2022, 6:17 PM

## 2022-11-12 ENCOUNTER — Other Ambulatory Visit: Payer: Self-pay

## 2022-11-12 ENCOUNTER — Inpatient Hospital Stay (HOSPITAL_COMMUNITY)
Admission: AD | Admit: 2022-11-12 | Discharge: 2022-11-12 | Disposition: A | Discharge status: Home / Self Care | Payer: 59 | Attending: Obstetrics & Gynecology | Admitting: Obstetrics & Gynecology

## 2022-11-12 ENCOUNTER — Inpatient Hospital Stay (HOSPITAL_COMMUNITY)
Admit: 2022-11-12 | Discharge: 2022-11-12 | Disposition: A | Discharge status: Home / Self Care | Payer: 59 | Attending: Obstetrics & Gynecology | Admitting: Obstetrics & Gynecology

## 2022-11-12 DIAGNOSIS — Z3A08 8 weeks gestation of pregnancy: Secondary | ICD-10-CM | POA: Diagnosis not present

## 2022-11-12 DIAGNOSIS — O3680X0 Pregnancy with inconclusive fetal viability, not applicable or unspecified: Secondary | ICD-10-CM

## 2022-11-12 DIAGNOSIS — O209 Hemorrhage in early pregnancy, unspecified: Secondary | ICD-10-CM | POA: Diagnosis present

## 2022-11-12 DIAGNOSIS — O0289 Other abnormal products of conception: Secondary | ICD-10-CM

## 2022-11-12 LAB — HCG, QUANTITATIVE, PREGNANCY: hCG, Beta Chain, Quant, S: 324 m[IU]/mL — ABNORMAL HIGH (ref <5)

## 2022-11-12 NOTE — MAU Note (Signed)
Cynthia Arnold is a 27 y.o. at [redacted]w[redacted]d here in MAU reporting: she's here for HCG level.  Denies pain, has small amount VB. LMP: 09/16/2022 Onset of complaint: ongoing Pain score: 0 Vitals:   11/12/22 1613  BP: (!) 107/59  Pulse: 84  Resp: 19  Temp: 98.3 F (36.8 C)  SpO2: 97%     FHT:NA Lab orders placed from triage:   HCG Level

## 2022-11-12 NOTE — MAU Provider Note (Signed)
History     CSN: [id number]  Arrival date and time: 11/12/22 1541   Event Date/Time   First Provider Initiated Contact with Patient 11/12/22 1824      Chief Complaint  Patient presents with   HCG Level   Cynthia Arnold is a 27 y.o. G2P1001 at [redacted]w[redacted]d who presents for follow up for bHCG. She endorses scant vaginal bleeding when wiping. She denies pelvic cramping.   OB History     Gravida  2   Para  1   Term  1   Preterm      AB      Living  1      SAB      IAB      Ectopic      Multiple  0   Live Births  1           Past Medical History:  Diagnosis Date   Depression    PP    Past Surgical History:  Procedure Laterality Date   NO PAST SURGERIES      Family History  Problem Relation Age of Onset   Healthy Mother    Healthy Father     Social History   Tobacco Use   Smoking status: Never   Smokeless tobacco: Never  Vaping Use   Vaping status: Never Used  Substance Use Topics   Alcohol use: Not Currently   Drug use: Never    Allergies: No Known Allergies  Medications Prior to Admission  Medication Sig Dispense Refill Last Dose   acetaminophen (TYLENOL) 325 MG tablet Take 2 tablets (650 mg total) by mouth every 4 (four) hours as needed (for pain scale < 4). 90 tablet 1    hydrOXYzine (ATARAX/VISTARIL) 25 MG tablet Take 1 tablet (25 mg total) by mouth every 6 (six) hours. 12 tablet 0    Prenatal Vit-Fe Fumarate-FA (PRENATAL MULTIVITAMIN) TABS tablet Take 1 tablet by mouth daily at 12 noon.       Review of Systems  Gastrointestinal:  Negative for abdominal pain.  Genitourinary:  Positive for vaginal bleeding.   Physical Exam   Blood pressure (!) 107/59, pulse 84, temperature 98.3 F (36.8 C), temperature source Oral, resp. rate 19, weight 63.5 kg, last menstrual period 09/16/2022, SpO2 97%.  Physical Exam Constitutional:      General: She is not in acute distress.    Appearance: She is not ill-appearing, toxic-appearing or  diaphoretic.  Eyes:     Extraocular Movements: Extraocular movements intact.     Conjunctiva/sclera: Conjunctivae normal.  Cardiovascular:     Rate and Rhythm: Normal rate.  Pulmonary:     Effort: Pulmonary effort is normal.  Neurological:     Mental Status: She is alert and oriented to person, place, and time. Mental status is at baseline.  Psychiatric:        Mood and Affect: Mood normal.        Behavior: Behavior normal.     MAU Course  Procedures  MDM Results for orders placed or performed during the hospital encounter of 11/12/22 (from the past 24 hour(s))  hCG, quantitative, pregnancy     Status: Abnormal   Collection Time: 11/12/22  4:11 PM  Result Value Ref Range   hCG, Beta Chain, Quant, S 324 (H) <5 mIU/mL     Assessment and Plan  1. Pregnancy of unknown anatomic location bHCG significantly decreased from 1900>324. Small amount of VB without pelvic cramping.  -Follow up with weekly bHCGs -Offered  reassurance of future pregnancies with normal viability -Encouraged allowing a few normal menstrual cycle prior to trying to conceive again.   2. Non-viable pregnancy  3. [redacted] weeks gestation of pregnancy    Rebeckah Masih Autry-Lott 11/12/2022, 6:24 PM

## 2023-05-03 NOTE — L&D Delivery Note (Signed)
 Delivery Note:   G3P1001 at [redacted]w[redacted]d  Admitting diagnosis: Normal labor [O80, Z37.9] Risks: None Onset of labor: 12/29/2023 at 1730 IOL/Augmentation: Pitocin  ROM: 12/29/2023 at 1730, clear fluid  Complete dilation at 12/30/2023 0935 Onset of pushing at 0940 FHR second stage Cat II, variables  Analgesia/Anesthesia intrapartum:Epidural Pushing in lithotomy position with CNM and L&D staff support. Husband, Devyn, present for birth and supportive.  Delivery of a Live born female  Birth Weight:  pending APGAR: 9, 9  Newborn Delivery   Birth date/time: 12/30/2023 09:46:00 Delivery type: Vaginal, Spontaneous    in cephalic presentation, position OA to ROA.  APGAR:1 min-9 , 5 min-9   Nuchal Cord: No  Cord double clamped after cessation of pulsation, cut by Devyn.  Collection of cord blood for typing completed. Arterial cord blood sample-No   Placenta delivered-Spontaneous with 3 vessels. Uterotonics: Pitocin  Placenta to L&D Uterine tone firm  Bleeding scant  None laceration identified.  Episiotomy:None Local analgesia: N/A  Repair: N/A Est. Blood Loss (mL):128.00  Complications: None  Mom to postpartum. Ezella Lowe to Couplet care / Skin to Skin.  Delivery Report:   Review the Delivery Report for details.    Alan MARLA Molt, CNM, MSN 12/30/2023, 10:11 AM

## 2023-06-15 LAB — HEPATITIS C ANTIBODY: HCV Ab: NEGATIVE

## 2023-06-15 LAB — OB RESULTS CONSOLE HIV ANTIBODY (ROUTINE TESTING): HIV: NONREACTIVE

## 2023-06-15 LAB — OB RESULTS CONSOLE HEPATITIS B SURFACE ANTIGEN: Hepatitis B Surface Ag: NEGATIVE

## 2023-06-15 LAB — OB RESULTS CONSOLE RPR: RPR: NONREACTIVE

## 2023-06-15 LAB — OB RESULTS CONSOLE RUBELLA ANTIBODY, IGM: Rubella: IMMUNE

## 2023-07-04 DIAGNOSIS — Z369 Encounter for antenatal screening, unspecified: Secondary | ICD-10-CM | POA: Diagnosis not present

## 2023-07-05 LAB — OB RESULTS CONSOLE GC/CHLAMYDIA
Chlamydia: NEGATIVE
Neisseria Gonorrhea: NEGATIVE

## 2023-10-18 DIAGNOSIS — Z369 Encounter for antenatal screening, unspecified: Secondary | ICD-10-CM | POA: Diagnosis not present

## 2023-10-31 DIAGNOSIS — R7309 Other abnormal glucose: Secondary | ICD-10-CM | POA: Diagnosis not present

## 2023-12-15 LAB — OB RESULTS CONSOLE GBS: GBS: NEGATIVE

## 2023-12-29 ENCOUNTER — Other Ambulatory Visit: Payer: Self-pay

## 2023-12-29 ENCOUNTER — Inpatient Hospital Stay (HOSPITAL_COMMUNITY)
Admission: AD | Admit: 2023-12-29 | Discharge: 2023-12-31 | DRG: 807 | Disposition: A | Discharge status: Home / Self Care | Attending: Obstetrics and Gynecology | Admitting: Obstetrics and Gynecology

## 2023-12-29 ENCOUNTER — Encounter (HOSPITAL_COMMUNITY): Payer: Self-pay | Admitting: Obstetrics and Gynecology

## 2023-12-29 DIAGNOSIS — Z3A38 38 weeks gestation of pregnancy: Secondary | ICD-10-CM

## 2023-12-29 DIAGNOSIS — O26893 Other specified pregnancy related conditions, third trimester: Secondary | ICD-10-CM | POA: Diagnosis present

## 2023-12-29 LAB — TYPE AND SCREEN
ABO/RH(D): B POS
Antibody Screen: NEGATIVE

## 2023-12-29 LAB — CBC
HCT: 34 % — ABNORMAL LOW (ref 36.0–46.0)
Hemoglobin: 11.4 g/dL — ABNORMAL LOW (ref 12.0–15.0)
MCH: 30.2 pg (ref 26.0–34.0)
MCHC: 33.5 g/dL (ref 30.0–36.0)
MCV: 89.9 fL (ref 80.0–100.0)
Platelets: 279 K/uL (ref 150–400)
RBC: 3.78 MIL/uL — ABNORMAL LOW (ref 3.87–5.11)
RDW: 12.6 % (ref 11.5–15.5)
WBC: 13.6 K/uL — ABNORMAL HIGH (ref 4.0–10.5)
nRBC: 0 % (ref 0.0–0.2)

## 2023-12-29 LAB — POCT FERN TEST: POCT Fern Test: POSITIVE

## 2023-12-29 MED ORDER — LIDOCAINE HCL (PF) 1 % IJ SOLN: 30.0000 mL | INTRAMUSCULAR | Status: DC | PRN

## 2023-12-29 MED ORDER — ONDANSETRON HCL 4 MG/2ML IJ SOLN: 4.0000 mg | Freq: Four times a day (QID) | INTRAMUSCULAR | Status: DC | PRN

## 2023-12-29 MED ORDER — OXYCODONE-ACETAMINOPHEN 5-325 MG PO TABS: 2.0000 | ORAL_TABLET | ORAL | Status: DC | PRN

## 2023-12-29 MED ORDER — LACTATED RINGERS IV SOLN
500.0000 mL | INTRAVENOUS | Status: DC | PRN
  Administered 2023-12-30: 500 mL via INTRAVENOUS

## 2023-12-29 MED ORDER — OXYTOCIN BOLUS FROM INFUSION
333.0000 mL | Freq: Once | INTRAVENOUS | Status: AC
  Administered 2023-12-30: 333 mL via INTRAVENOUS

## 2023-12-29 MED ORDER — ACETAMINOPHEN 325 MG PO TABS: 650.0000 mg | ORAL_TABLET | ORAL | Status: DC | PRN

## 2023-12-29 MED ORDER — OXYTOCIN-SODIUM CHLORIDE 30-0.9 UT/500ML-% IV SOLN
2.5000 [IU]/h | INTRAVENOUS | Status: DC
  Filled 2023-12-29: qty 500

## 2023-12-29 MED ORDER — SOD CITRATE-CITRIC ACID 500-334 MG/5ML PO SOLN: 30.0000 mL | ORAL | Status: DC | PRN

## 2023-12-29 MED ORDER — LACTATED RINGERS IV SOLN: INTRAVENOUS | Status: DC

## 2023-12-29 MED ORDER — FLEET ENEMA RE ENEM: 1.0000 | ENEMA | RECTAL | Status: DC | PRN

## 2023-12-29 MED ORDER — FENTANYL CITRATE (PF) 100 MCG/2ML IJ SOLN
50.0000 ug | INTRAMUSCULAR | Status: DC | PRN
  Administered 2023-12-30 (×2): 100 ug via INTRAVENOUS
  Filled 2023-12-29 (×2): qty 2

## 2023-12-29 NOTE — MAU Note (Signed)
 Pt says at 530pm- was driving her car- felt a gush- came home - changed - another gush- and now trickling - clear . Feels baby moving . [facility]- [facility]- [facility]- on Tuesday - unsure of dilation.  Feels back pain - No UC's -  Denies HSV GBS- neg

## 2023-12-29 NOTE — MAU Note (Signed)

## 2023-12-29 NOTE — H&P (Signed)
 OB ADMISSION/ HISTORY & PHYSICAL:  Admission Date: 12/29/2023  8:22 PM  Admit Diagnosis: Normal labor  Cynthia Arnold is a 28 y.o. female G3P1001 [redacted]w[redacted]d presenting for loss of fluid at 1730. Endorses active FM, denies vaginal bleeding, and does not perceive contractions.   History of current pregnancy: G3P1001   Patient entered care with [facility] at 10+5 wks.   EDC 01/11/24 by LMP and congruent w/ 1ST TRIMESTER wk U/S.   Anatomy scan:  complete w/ anterior placenta.   Last evaluation: 34+3 wks cephalic/ AFI 12/ EFW 5+7 (63%)   Significant prenatal events:  Patient Active Problem List   Diagnosis Date Noted   Normal labor 12/29/2023    Prenatal Labs: ABO, Rh: --/--/B POS (08/29 2244) Antibody: NEG (08/29 2244) Rubella:   immune RPR:   NR HBsAg:   NR HIV:   NR GTT: abnormal 1 hr, normal 3 hour GBS:   neg 12/11/23 GC/CHL: neg/neg Genetics: low-risk Vaccines: Tdap: declined Influenza: declined   OB History  Gravida Para Term Preterm AB Living  3 1 1   1   SAB IAB Ectopic Multiple Live Births     0 1    # Outcome Date GA Lbr Len/2nd Weight Sex Type Anes PTL Lv  3 Current           2 Term 09/01/18 [redacted]w[redacted]d / 02:12 3019 g F Vag-Spont EPI  LIV     Birth Comments: WNL  1 Gravida             Medical / Surgical History: Past medical history:  Past Medical History:  Diagnosis Date   Depression    PP    Past surgical history:  Past Surgical History:  Procedure Laterality Date   NO PAST SURGERIES     Family History:  Family History  Problem Relation Age of Onset   Healthy Mother    Healthy Father     Social History:  reports that she has never smoked. She has never used smokeless tobacco. She reports that she does not currently use alcohol. She reports that she does not use drugs.  Allergies: Patient has no known allergies.   Current Medications at time of admission:  Prior to Admission medications   Medication Sig Start Date End Date Taking? Authorizing Provider   cholecalciferol (VITAMIN D3) 25 MCG (1000 UNIT) tablet Take 1,000 Units by mouth daily.   Yes [provider]  Prenatal Vit-Fe Fumarate-FA (PRENATAL MULTIVITAMIN) TABS tablet Take 1 tablet by mouth daily at 12 noon.   Yes [provider]  acetaminophen  (TYLENOL ) 325 MG tablet Take 2 tablets (650 mg total) by mouth every 4 (four) hours as needed (for pain scale < 4). 09/03/18   Dove, Myra C, MD  hydrOXYzine  (ATARAX /VISTARIL ) 25 MG tablet Take 1 tablet (25 mg total) by mouth every 6 (six) hours. 05/19/19   Neldon Hamp RAMAN, PA    Review of Systems: Constitutional: Negative   HENT: Negative   Eyes: Negative   Respiratory: Negative   Cardiovascular: Negative   Gastrointestinal: Negative  Genitourinary: neg for bloody show, pos for LOF   Musculoskeletal: Negative   Skin: Negative   Neurological: Negative   Endo/Heme/Allergies: Negative   Psychiatric/Behavioral: Negative    Physical Exam: VS: Blood pressure 112/67, pulse 92, temperature 98.5 F (36.9 C), temperature source Oral, resp. rate 12, height 5' 1 (1.549 m), weight 74.8 kg, unknown if currently breastfeeding. AAO x3, no signs of distress Cardiovascular: RRR Respiratory: Unlabored GU/GI: Abdomen gravid, non-tender,  non-distended, active FM, vertex  Extremities: TRACE edema, negative for pain, tenderness, and cords  Cervical exam:Dilation: 3 Effacement (%): 70 Station: -3 Exam by:: CNM Mercer Peal AROM of forebag, clear fluid FHR: baseline rate 130 / variability moderate / accelerations present / absent decelerations TOCO: 3-5   Prenatal Transfer Tool  Maternal Diabetes: No Genetic Screening: Normal Maternal Ultrasounds/Referrals: Normal Fetal Ultrasounds or other Referrals:  None Maternal Substance Abuse:  No Significant Maternal Medications:  None Significant Maternal Lab Results: Group B Strep negative Number of Prenatal Visits:greater than 3 verified prenatal visits Maternal Vaccinations:  declined tdap and influenza Other Comments:  None    Assessment: 28 y.o. G3P1001 [redacted]w[redacted]d  Latent stage of labor SROM FHR category 1 GBS neg Pain management plan: IV sedation, epidural   Plan:  Admit to L&D Routine admission orders Epidural PRN Expectant management Dr Rosalva notified of admission and plan of care  Mercer KATHEE Peal DNP, CNM 12/29/2023 9:50 PM

## 2023-12-30 ENCOUNTER — Encounter (HOSPITAL_COMMUNITY): Payer: Self-pay | Admitting: Obstetrics and Gynecology

## 2023-12-30 ENCOUNTER — Inpatient Hospital Stay (HOSPITAL_COMMUNITY): Admitting: Anesthesiology

## 2023-12-30 LAB — RPR: RPR Ser Ql: NONREACTIVE

## 2023-12-30 MED ORDER — EPHEDRINE 5 MG/ML INJ: 10.0000 mg | INTRAVENOUS | Status: DC | PRN

## 2023-12-30 MED ORDER — LIDOCAINE HCL (PF) 1 % IJ SOLN
INTRAMUSCULAR | Status: DC | PRN
  Administered 2023-12-30: 5 mL via EPIDURAL

## 2023-12-30 MED ORDER — TERBUTALINE SULFATE 1 MG/ML IJ SOLN: 0.2500 mg | Freq: Once | INTRAMUSCULAR | Status: DC | PRN

## 2023-12-30 MED ORDER — PRENATAL MULTIVITAMIN CH
1.0000 | ORAL_TABLET | Freq: Every day | ORAL | Status: DC
  Administered 2023-12-30 – 2023-12-31 (×2): 1 via ORAL
  Filled 2023-12-30 (×2): qty 1

## 2023-12-30 MED ORDER — DIBUCAINE (PERIANAL) 1 % EX OINT
1.0000 | TOPICAL_OINTMENT | CUTANEOUS | Status: DC | PRN
Start: 2023-12-30 — End: 2023-12-31

## 2023-12-30 MED ORDER — ONDANSETRON HCL 4 MG/2ML IJ SOLN: 4.0000 mg | INTRAMUSCULAR | Status: DC | PRN

## 2023-12-30 MED ORDER — PHENYLEPHRINE 80 MCG/ML (10ML) SYRINGE FOR IV PUSH (FOR BLOOD PRESSURE SUPPORT): 80.0000 ug | PREFILLED_SYRINGE | INTRAVENOUS | Status: DC | PRN

## 2023-12-30 MED ORDER — OXYTOCIN-SODIUM CHLORIDE 30-0.9 UT/500ML-% IV SOLN
1.0000 m[IU]/min | INTRAVENOUS | Status: DC
  Administered 2023-12-30: 2 m[IU]/min via INTRAVENOUS

## 2023-12-30 MED ORDER — SENNOSIDES-DOCUSATE SODIUM 8.6-50 MG PO TABS
2.0000 | ORAL_TABLET | Freq: Every day | ORAL | Status: DC
  Filled 2023-12-30: qty 2

## 2023-12-30 MED ORDER — ACETAMINOPHEN 325 MG PO TABS: 650.0000 mg | ORAL_TABLET | ORAL | Status: DC | PRN

## 2023-12-30 MED ORDER — DIPHENHYDRAMINE HCL 25 MG PO CAPS: 25.0000 mg | ORAL_CAPSULE | Freq: Four times a day (QID) | ORAL | Status: DC | PRN

## 2023-12-30 MED ORDER — IBUPROFEN 600 MG PO TABS
600.0000 mg | ORAL_TABLET | Freq: Four times a day (QID) | ORAL | Status: DC
  Administered 2023-12-30 – 2023-12-31 (×5): 600 mg via ORAL
  Filled 2023-12-30 (×5): qty 1

## 2023-12-30 MED ORDER — TETANUS-DIPHTH-ACELL PERTUSSIS 5-2.5-18.5 LF-MCG/0.5 IM SUSY: 0.5000 mL | PREFILLED_SYRINGE | Freq: Once | INTRAMUSCULAR | Status: DC

## 2023-12-30 MED ORDER — COCONUT OIL OIL: 1.0000 | TOPICAL_OIL | Status: DC | PRN

## 2023-12-30 MED ORDER — ZOLPIDEM TARTRATE 5 MG PO TABS: 5.0000 mg | ORAL_TABLET | Freq: Every evening | ORAL | Status: DC | PRN

## 2023-12-30 MED ORDER — FENTANYL-BUPIVACAINE-NACL 0.5-0.125-0.9 MG/250ML-% EP SOLN
EPIDURAL | Status: DC | PRN
  Administered 2023-12-30: 12 mL/h via EPIDURAL

## 2023-12-30 MED ORDER — PHENYLEPHRINE 80 MCG/ML (10ML) SYRINGE FOR IV PUSH (FOR BLOOD PRESSURE SUPPORT)
80.0000 ug | PREFILLED_SYRINGE | INTRAVENOUS | Status: DC | PRN
  Administered 2023-12-30: 80 ug via INTRAVENOUS
  Filled 2023-12-30: qty 10

## 2023-12-30 MED ORDER — ONDANSETRON HCL 4 MG PO TABS: 4.0000 mg | ORAL_TABLET | ORAL | Status: DC | PRN

## 2023-12-30 MED ORDER — FENTANYL-BUPIVACAINE-NACL 0.5-0.125-0.9 MG/250ML-% EP SOLN
12.0000 mL/h | EPIDURAL | Status: DC | PRN
  Filled 2023-12-30: qty 250

## 2023-12-30 MED ORDER — WITCH HAZEL-GLYCERIN EX PADS
1.0000 | MEDICATED_PAD | CUTANEOUS | Status: DC | PRN
Start: 2023-12-30 — End: 2023-12-31

## 2023-12-30 MED ORDER — SIMETHICONE 80 MG PO CHEW: 80.0000 mg | CHEWABLE_TABLET | ORAL | Status: DC | PRN

## 2023-12-30 MED ORDER — BENZOCAINE-MENTHOL 20-0.5 % EX AERO
1.0000 | INHALATION_SPRAY | CUTANEOUS | Status: DC | PRN
  Filled 2023-12-30: qty 56

## 2023-12-30 MED ORDER — DIPHENHYDRAMINE HCL 50 MG/ML IJ SOLN: 12.5000 mg | INTRAMUSCULAR | Status: DC | PRN

## 2023-12-30 MED ORDER — LACTATED RINGERS IV SOLN
500.0000 mL | Freq: Once | INTRAVENOUS | Status: AC
  Administered 2023-12-30: 500 mL via INTRAVENOUS

## 2023-12-30 NOTE — Progress Notes (Addendum)
 S: Comfortable with epidural.   O: Vitals:   12/30/23 0826 12/30/23 0828 12/30/23 0830 12/30/23 0831  BP: (!) 89/44 (!) 97/56  (!) 97/53  Pulse: 90 93  86  Resp:      Temp:      TempSrc:      SpO2:   100%   Weight:      Height:       FHT:  FHR: 120 bpm, variability: moderate,  accelerations:  Present,  decelerations:  Present variables UC:   regular, every 2-3 minutes SVE:   Dilation: 9 Effacement (%): 80 Station: 0 Exam by:: Haniyyah Sakuma CNM  A / P: Augmentation of labor, progressing well  Fetal Wellbeing:  Category II GBS: Negative Pain Control:  Epidural Anticipated MOD:  NSVD  Anticipate SVD soon. FHT improving with intrauterine resuscitative measures.   Alan MARLA Molt, CNM, MSN 12/30/2023, 8:39 AM

## 2023-12-30 NOTE — Anesthesia Preprocedure Evaluation (Addendum)
 Anesthesia Evaluation  Patient identified by MRN, date of birth, ID band Patient awake    Reviewed: Allergy & Precautions, NPO status , Patient's Chart, lab work & pertinent test results  Airway Mallampati: II  TM Distance: >3 FB Neck ROM: Full    Dental no notable dental hx. (+) Teeth Intact, Dental Advisory Given   Pulmonary neg pulmonary ROS   Pulmonary exam normal breath sounds clear to auscultation       Cardiovascular negative cardio ROS Normal cardiovascular exam Rhythm:Regular Rate:Normal     Neuro/Psych  PSYCHIATRIC DISORDERS  Depression    negative neurological ROS     GI/Hepatic negative GI ROS, Neg liver ROS,,,  Endo/Other  negative endocrine ROS    Renal/GU negative Renal ROS     Musculoskeletal negative musculoskeletal ROS (+)    Abdominal   Peds  Hematology Lab Results      Component                Value               Date                      WBC                      13.6 (H)            12/29/2023                HGB                      11.4 (L)            12/29/2023                HCT                      34.0 (L)            12/29/2023                MCV                      89.9                12/29/2023                PLT                      279                 12/29/2023              Anesthesia Other Findings   Reproductive/Obstetrics (+) Pregnancy                              Anesthesia Physical Anesthesia Plan  ASA: 2  Anesthesia Plan: Epidural   Post-op Pain Management:    Induction:   PONV Risk Score and Plan:   Airway Management Planned:   Additional Equipment:   Intra-op Plan:   Post-operative Plan:   Informed Consent: I have reviewed the patients History and Physical, chart, labs and discussed the procedure including the risks, benefits and alternatives for the proposed anesthesia with the patient or authorized representative who has indicated  his/her understanding and acceptance.       Plan Discussed with:  Anesthesia Plan Comments: (38.6 wk G3P1 For LEA)         Anesthesia Quick Evaluation

## 2023-12-30 NOTE — Anesthesia Procedure Notes (Signed)
 Epidural Patient location during procedure: OB Start time: 12/30/2023 7:20 AM End time: 12/30/2023 7:35 AM  Staffing Anesthesiologist: Jefm Garnette LABOR, MD Performed: anesthesiologist   Preanesthetic Checklist Completed: patient identified, IV checked, site marked, risks and benefits discussed, surgical consent, monitors and equipment checked, pre-op evaluation and timeout performed  Epidural Patient position: sitting Prep: DuraPrep and site prepped and draped Patient monitoring: continuous pulse ox and blood pressure Approach: midline Location: L3-L4 Injection technique: LOR air  Needle:  Needle type: Tuohy  Needle gauge: 17 G Needle length: 9 cm and 9 Needle insertion depth: 7 cm Catheter type: closed end flexible Catheter size: 19 Gauge Catheter at skin depth: 13 cm Test dose: negative  Assessment Events: blood not aspirated, no cerebrospinal fluid, injection not painful, no injection resistance, no paresthesia and negative IV test  Additional Notes Patient identified. Risks/Benefits/Options discussed with patient including but not limited to bleeding, infection, nerve damage, paralysis, failed block, incomplete pain control, headache, blood pressure changes, nausea, vomiting, reactions to medication both or allergic, itching and postpartum back pain. Confirmed with bedside nurse the patient's most recent platelet count. Confirmed with patient that they are not currently taking any anticoagulation, have any bleeding history or any family history of bleeding disorders. Patient expressed understanding and wished to proceed. All questions were answered. Sterile technique was used throughout the entire procedure. Please see nursing notes for vital signs. Test dose was given through epidural needle and negative prior to continuing to dose epidural or start infusion. Warning signs of high block given to the patient including shortness of breath, tingling/numbness in hands, complete  motor block, or any concerning symptoms with instructions to call for help. Patient was given instructions on fall risk and not to get out of bed. All questions and concerns addressed with instructions to call with any issues.  3 Attempt (S) . Patient tolerated procedure well.

## 2023-12-30 NOTE — Progress Notes (Signed)
 CNM at [facility], pt give coaching on pushing, first push with good effort and movement

## 2023-12-30 NOTE — Progress Notes (Signed)
 Subjective:    Doing well, perceives contractions as mild and tolerable. Sitting on ball for comfort. No cervical change. Discussed augmentation w/ Pitocin  and pt agrees.   Objective:    VS: BP 114/67   Pulse 71   Temp 97.6 F (36.4 C) (Oral)   Resp 16   Ht 5' 1 (1.549 m)   Wt 74.8 kg   BMI 31.14 kg/m  FHR : baseline 135 / variability moderate / accelerations present / absent decelerations Toco: contractions every 2-5 minutes  Membranes: SROM x8.5 hrs, remains clear Dilation: 3 Effacement (%): 70 Cervical Position: Posterior Station: -3 Presentation: Vertex Exam by:: Jondavid Schreier, cnm Pitocin  2 mU/min  Assessment/Plan:   28 y.o. G3P1001 [redacted]w[redacted]d  Labor: augmenting with Pitocin  Fetal Wellbeing:  Category I Pain Control:  plans IV sedation and epidural I/D:  GBS neg Anticipated MOD:  NSVD  Cynthia KATHEE Peal DNP, CNM 12/30/2023 2:21 AM

## 2023-12-30 NOTE — Lactation Note (Signed)
 This note was copied from a baby's chart. Lactation Consultation Note  Patient Name: Cynthia Arnold Date: 12/30/2023 Age:28 hours Reason for consult: Initial assessment;1st time breastfeeding;Early term 37-38.6wks;Breastfeeding assistance;RN request  P2- RN asked for LC to assist with infant's first feeding. Per RN, she was able to latch infant, but infant would not suck. MOB reports that her first child could not latch to the breast, so she exclusively formula fed. MOB would like to breast feed, but plans to offer formula as supplementation if needed. Infant was easy to awaken from her sleep. LC placed infant on the right breast in the cross cradle hold. Infant would latch, but not suck even with stimulation. After 10 minutes of trying to stimulate infant, LC encouraged hand expressing to offer EBM then attempt a latch again. MOB was able to hand express 0.5 mL of EBM, which LC then finger fed to infant. This seemed to stimulate infant, so we placed her on the left breast in the football hold. Infant was able to latch and nursed on and off or 8 minutes with stimulation. Infant had flanged lips and a rhythmic suck.   LC reviewed the first 24 hr birthday nap, day 2 cluster feeding, feeding infant on cue 8-12x in 24 hrs, not allowing infant to go over 3 hrs without a feeding, CDC milk storage guidelines, LC services handout and engorgement/breast care. LC encouraged MOB to call for further assistance as needed. MOB does not have a pump, so a STORK referral has been sent.  Maternal Data Has patient been taught Hand Expression?: Yes Does the patient have breastfeeding experience prior to this delivery?: No (per MOB her first child would not latch)  Feeding Mother's Current Feeding Choice: Breast Milk and Formula  LATCH Score Latch: Repeated attempts needed to sustain latch, nipple held in mouth throughout feeding, stimulation needed to elicit sucking reflex.  Audible Swallowing:  None  Type of Nipple: Everted at rest and after stimulation  Comfort (Breast/Nipple): Soft / non-tender  Hold (Positioning): Full assist, staff holds infant at breast  LATCH Score: 5   Lactation Tools Discussed/Used Pump Education: Milk Storage  Interventions Interventions: Breast feeding basics reviewed;Assisted with latch;Hand express;Breast compression;Adjust position;Support pillows;Position options;Expressed milk;Education;LC Services brochure  Discharge Discharge Education: Engorgement and breast care;Warning signs for feeding baby Pump: Referral sent for [facility] Pump  Consult Status Consult Status: Follow-up Date: 12/31/23 Follow-up type: In-patient    Recardo Hoit BS, IBCLC 12/30/2023, 1:37 PM

## 2023-12-31 LAB — CBC
HCT: 29.9 % — ABNORMAL LOW (ref 36.0–46.0)
Hemoglobin: 10.1 g/dL — ABNORMAL LOW (ref 12.0–15.0)
MCH: 30.3 pg (ref 26.0–34.0)
MCHC: 33.8 g/dL (ref 30.0–36.0)
MCV: 89.8 fL (ref 80.0–100.0)
Platelets: 228 K/uL (ref 150–400)
RBC: 3.33 MIL/uL — ABNORMAL LOW (ref 3.87–5.11)
RDW: 12.7 % (ref 11.5–15.5)
WBC: 15.2 K/uL — ABNORMAL HIGH (ref 4.0–10.5)
nRBC: 0 % (ref 0.0–0.2)

## 2023-12-31 MED ORDER — IBUPROFEN 600 MG PO TABS
600.0000 mg | ORAL_TABLET | Freq: Four times a day (QID) | ORAL | 0 refills | Status: AC
Start: 2023-12-31 — End: ?

## 2023-12-31 NOTE — Progress Notes (Signed)
 CSW received consult for hx of postpartum depression.  CSW met with MOB to offer support and complete assessment. When CSW entered room, MOB was observed sitting in hospital bed holding infant. FOB was sitting on couch nearby. CSW introduced self and requested to speak with MOB alone. MOB provided verbal consent to continue with FOB present. CSW explained reason for visit. MOB presented as calm, welcomed CSW visit, and remained engaged during encounter.   CSW inquired how MOB is feeling emotionally since infant's arrival. MOB reports feeling good and excited. CSW inquired about MOB's mental health history. MOB was forthcoming about experiencing symptoms of postpartum depression after the birth of her first daughter, who is now 28 years old. MOB reports that she had to use a catheter for 2-3 weeks after giving birth due to ongoing numbness as a side effect after having an epidural. MOB recalled having difficulty caring for baby due to her health complications postpartum and shared how she isolated herself from others and cried a lot. MOB reports her symptoms resolved 2 months postpartum without treatment. MOB reported her mood improved after she returned to work. MOB denied additional mental health history. MOB reports she has had a better postpartum course with infant and has been relieved that she has not experienced any health complications after her epidural this time. CSW inquired about supports. MOB identified FOB, her mom, dad, and sister as all helpful and supportive. CSW assessed for safety. MOB denied current SI/HI.  CSW provided education regarding the baby blues period vs. perinatal mood disorders, discussed treatment and gave resources for mental health follow up if concerns arise.  CSW recommends self-evaluation during the postpartum time period using the New Mom Checklist from Postpartum Progress and encouraged MOB to contact a medical professional if symptoms are noted at any time.    MOB  reports she has all needed items for infant, including a car seat and bassinet. MOB states she is in need of more formula but plans to follow up with [facility] and has contact information. MOB has chosen [facility] in [place] for infant's follow up care.  CSW provided review of Sudden Infant Death Syndrome (SIDS) precautions.    CSW identifies no further need for intervention and no barriers to discharge at this time.  Signed,  Sharyne LOIS Roulette, MSW, LCSWA, LCASA 12/31/2023 11:39 AM

## 2023-12-31 NOTE — Discharge Summary (Signed)
 SVD OB Discharge Summary       Patient Name: Cynthia Arnold DOB: Jul 22, 1995 MRN: [id number]  Date of admission: 12/29/2023 Delivering MD: JOSHUA PALMA K Date of delivery: 8/30 Type of delivery: SVD  Newborn Data: Sex: Baby female Live born female  Birth Weight: 7 lb 12 oz (3515 g) APGAR: 9, 9  Newborn Delivery   Birth date/time: 12/30/2023 09:46:00 Delivery type: Vaginal, Spontaneous     Feeding: breast and bottle Infant being discharge to home with mother in stable condition.   Admitting diagnosis: Normal labor [O80, Z37.9] Intrauterine pregnancy: [redacted]w[redacted]d     Secondary diagnosis:  Principal Problem:   Postpartum care following vaginal delivery 8/30 Active Problems:   Normal labor   SVD (spontaneous vaginal delivery)                                Complications: None                                                              Intrapartum Procedures: spontaneous vaginal delivery Postpartum Procedures: none Complications-Operative and Postpartum: none Augmentation: Pitocin    History of Present Illness: Ms. Cynthia Arnold is a 28 y.o. female, G3P2002, who presents at [redacted]w[redacted]d weeks gestation. The patient has been followed at  [facility]  Her pregnancy has been complicated by:  Patient Active Problem List   Diagnosis Date Noted   SVD (spontaneous vaginal delivery) 12/30/2023   Postpartum care following vaginal delivery 8/30 12/30/2023   Normal labor 12/29/2023     Active Ambulatory Problems    Diagnosis Date Noted   No Active Ambulatory Problems   Resolved Ambulatory Problems    Diagnosis Date Noted   Supervision of normal pregnancy 01/30/2018   Post term pregnancy at [redacted] weeks gestation 08/31/2018   Postpartum hemorrhage 09/01/2018   Urinary retention 09/13/2018   Past Medical History:  Diagnosis Date   Depression      Hospital course:  Onset of Labor With Vaginal Delivery      28 y.o. yo [id number] at [redacted]w[redacted]d was admitted in  Latent Labor on 12/29/2023. Labor course was complicated bypt was admitted on 8/29 in latentent labor and progressed to SVD on 8/30 over intact pernium, ebl was 0 and hgb drop of 11.4-10.4, had baby female  Membrane Rupture Time/Date: 5:30 PM,12/29/2023  Delivery Method:Vaginal, Spontaneous Operative Delivery:N/A Episiotomy: None Lacerations:  None Patient had a postpartum course complicated by none.  She is ambulating, tolerating a regular diet, passing flatus, and urinating well. Patient is discharged home in stable condition on 12/31/23.  Newborn Data: Birth date:12/30/2023 Birth time:9:46 AM Gender:Female Living status:Living Apgars:9 ,9  Weight:3515 g Postpartum Day # 2 : S/P. Patient up ad lib, denies syncope or dizziness. Reports consuming regular diet without issues and denies N/V. Patient reports 0 bowel movement + passing flatus.  Denies issues with urination and reports bleeding is lighter.  Patient is br and btfeeding and reports going well.  Desires undecided for postpartum contraception.  Pain is being appropriately managed with use of po meds.   Physical exam  Vitals:   12/30/23 1652 12/30/23 2015 12/30/23 2356 12/31/23 0542  BP: 102/65 109/70 104/72 107/62  Pulse: 84 72 74  75  Resp: 18 18 18 18   Temp: 98.1 F (36.7 C) 97.9 F (36.6 C) 97.8 F (36.6 C) 98 F (36.7 C)  TempSrc: Oral Oral Oral Oral  SpO2: 99% 100% 100% 100%  Weight:      Height:       General: alert Lochia: appropriate Uterine Fundus: firm Perineum: approximate DVT Evaluation: No evidence of DVT seen on physical exam. Negative Homan's sign. No cords or calf tenderness. No significant calf/ankle edema.  Labs: Lab Results  Component Value Date   WBC 15.2 (H) 12/31/2023   HGB 10.1 (L) 12/31/2023   HCT 29.9 (L) 12/31/2023   MCV 89.8 12/31/2023   PLT 228 12/31/2023      Latest Ref Rng & Units 05/19/2019    3:45 PM  CMP  Glucose 70 - 99 mg/dL 98   BUN 6 - 20 mg/dL 9   Creatinine 9.55 -  1.00 mg/dL 9.37   Sodium 864 - 854 mmol/L 136   Potassium 3.5 - 5.1 mmol/L 4.0   Chloride 98 - 111 mmol/L 108   CO2 22 - 32 mmol/L 21   Calcium 8.9 - 10.3 mg/dL 9.0     Date of discharge: 12/31/2023 Discharge Diagnoses: Term Pregnancy-delivered Discharge instruction: per After Visit Summary and Baby and Me Booklet.  After visit meds:   Activity:           unrestricted and pelvic rest Advance as tolerated. Pelvic rest for 6 weeks.  Diet:                routine Medications: PNV Postpartum contraception: Undecided Condition:  Pt discharge to home with baby in stable  Meds: Allergies as of 12/31/2023   No Known Allergies      Medication List     TAKE these medications    acetaminophen  325 MG tablet Commonly known as: Tylenol  Take 2 tablets (650 mg total) by mouth every 4 (four) hours as needed (for pain scale < 4).   cholecalciferol 25 MCG (1000 UNIT) tablet Commonly known as: VITAMIN D3 Take 1,000 Units by mouth daily.   hydrOXYzine  25 MG tablet Commonly known as: ATARAX  Take 1 tablet (25 mg total) by mouth every 6 (six) hours.   ibuprofen  600 MG tablet Commonly known as: ADVIL  Take 1 tablet (600 mg total) by mouth every 6 (six) hours.   prenatal multivitamin Tabs tablet Take 1 tablet by mouth daily at 12 noon.        Discharge Follow Up:   Follow-up Information     [facility]. Schedule an appointment as soon as possible for a visit in 6 week(s).   Specialty: Obstetrics and Gynecology Why: 6 weeks PP Contact information: [address] [place]  [address] [phone]                 [facility] Dhyana Bastone  CNM, FNP-C, PMHNP-BC  [id number]  [place], [place] [address]  Cell: [phone]  Office Phone: [phone] Fax: [phone] 12/31/2023  8:33 AM

## 2023-12-31 NOTE — Lactation Note (Signed)
 This note was copied from a baby's chart. Lactation Consultation Note  Patient Name: Cynthia Arnold Date: 12/31/2023 Age:28 hours Reason for consult: Follow-up assessment;1st time breastfeeding;Early term 37-38.6wks  P2- MOB reports that infant has been too sleepy to latch to the breast since LC last saw her. MOB has been attempting, but has always followed up with a bottle of formula. LC reviewed the first 24 hr birthday nap and what today may look like with cluster feeding. LC encouraged MOB to continue offering the breast now that infant will be more alert. MOB's STORK pump had not come in yet. LC will follow up on the pump, but MOB is okay with it being shipped to her house tomorrow if needed. LC provided MOB with a manual pump and 18 mm flange just in case. LC encouraged MOB to call for further assistance as needed.  Maternal Data Has patient been taught Hand Expression?: Yes Does the patient have breastfeeding experience prior to this delivery?: No  Feeding Mother's Current Feeding Choice: Breast Milk and Formula  Lactation Tools Discussed/Used Tools: Pump;Flanges Flange Size: 18 Breast pump type: Manual Pump Education: Setup, frequency, and cleaning;Milk Storage Reason for Pumping: MOB does not have a pump Pumping frequency: 15-20 min every 3 hrs  Interventions Interventions: Breast feeding basics reviewed;Hand pump;Education;LC Services brochure  Discharge Discharge Education: Engorgement and breast care;Warning signs for feeding baby Pump: Manual;Referral sent for [facility] Pump  Consult Status Consult Status: Complete Date: 12/31/23    Recardo Hoit BS, IBCLC 12/31/2023, 10:11 AM

## 2023-12-31 NOTE — Anesthesia Postprocedure Evaluation (Signed)
 Anesthesia Post Note  Patient: Cynthia Arnold  Procedure(s) Performed: AN AD HOC LABOR EPIDURAL     Patient location during evaluation: Mother Baby Anesthesia Type: Epidural Level of consciousness: awake, awake and alert and oriented Respiratory status: spontaneous breathing, nonlabored ventilation and respiratory function stable Cardiovascular status: blood pressure returned to baseline and stable Postop Assessment: able to ambulate and adequate PO intake Anesthetic complications: no   No notable events documented.  Last Vitals:  Vitals:   12/30/23 2356 12/31/23 0542  BP: 104/72 107/62  Pulse: 74 75  Resp: 18 18  Temp: 36.6 C 36.7 C  SpO2: 100% 100%    Last Pain:  Vitals:   12/31/23 1108  TempSrc:   PainSc: 0-No pain   Pain Goal:                   Sybil Shrader

## 2024-01-10 ENCOUNTER — Telehealth (HOSPITAL_COMMUNITY): Payer: Self-pay | Admitting: *Deleted

## 2024-01-10 NOTE — Telephone Encounter (Signed)
 01/10/2024  Name: Shakaya Bhullar MRN: [id number] DOB: [birth year]  Reason for Call:  Transition of Care Hospital Discharge Call  Contact Status: Patient Contact Status: Complete  Language assistant needed:          Follow-Up Questions:    Van Postnatal Depression Scale:  In the Past 7 Days:    PHQ2-9 Depression Scale:     Discharge Follow-up:    Post-discharge interventions: NA  Mliss Sieve, RN 01/10/2024 15:46
